# Patient Record
Sex: Female | Born: 1985
Health system: Southern US, Community
[De-identification: ages and names within clinical notes are randomized; demographics above are authoritative.]

## PROBLEM LIST (undated history)

## (undated) DIAGNOSIS — D6859 Other primary thrombophilia: Secondary | ICD-10-CM

## (undated) DIAGNOSIS — D649 Anemia, unspecified: Secondary | ICD-10-CM

## (undated) DIAGNOSIS — J45909 Unspecified asthma, uncomplicated: Secondary | ICD-10-CM

## (undated) DIAGNOSIS — Z8619 Personal history of other infectious and parasitic diseases: Secondary | ICD-10-CM

## (undated) DIAGNOSIS — O365990 Maternal care for other known or suspected poor fetal growth, unspecified trimester, not applicable or unspecified: Secondary | ICD-10-CM

## (undated) HISTORY — DX: Anemia, unspecified: D64.9

## (undated) HISTORY — DX: Unspecified asthma, uncomplicated: J45.909

## (undated) HISTORY — DX: Personal history of other infectious and parasitic diseases: Z86.19

## (undated) HISTORY — PX: NO PAST SURGERIES: SHX2092

## (undated) HISTORY — DX: Maternal care for other known or suspected poor fetal growth, unspecified trimester, not applicable or unspecified: O36.5990

---
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
## 2011-07-27 LAB — HM PAP SMEAR

## 2011-09-02 ENCOUNTER — Ambulatory Visit: Payer: Self-pay | Admitting: Family Medicine

## 2011-09-14 ENCOUNTER — Ambulatory Visit (INDEPENDENT_AMBULATORY_CARE_PROVIDER_SITE_OTHER): Payer: 59 | Admitting: Family Medicine

## 2011-09-14 ENCOUNTER — Encounter: Payer: Self-pay | Admitting: Family Medicine

## 2011-09-14 VITALS — BP 110/60 | HR 80 | Temp 99.2°F | Ht 65.5 in | Wt 113.2 lb

## 2011-09-14 DIAGNOSIS — Z0000 Encounter for general adult medical examination without abnormal findings: Secondary | ICD-10-CM

## 2011-09-14 LAB — HEPATIC FUNCTION PANEL
ALT: 14 U/L (ref 0–35)
AST: 20 U/L (ref 0–37)
Albumin: 4.4 g/dL (ref 3.5–5.2)
Alkaline Phosphatase: 48 U/L (ref 39–117)
Bilirubin, Direct: 0 mg/dL (ref 0.0–0.3)
Total Bilirubin: 0.6 mg/dL (ref 0.3–1.2)
Total Protein: 8 g/dL (ref 6.0–8.3)

## 2011-09-14 LAB — POCT URINALYSIS DIPSTICK
Bilirubin, UA: NEGATIVE
Glucose, UA: NEGATIVE
Ketones, UA: NEGATIVE
Leukocytes, UA: NEGATIVE
Nitrite, UA: NEGATIVE
Protein, UA: NEGATIVE
Spec Grav, UA: 1.02
Urobilinogen, UA: 0.2
pH, UA: 6.5

## 2011-09-14 LAB — CBC WITH DIFFERENTIAL/PLATELET
Basophils Absolute: 0 10*3/uL (ref 0.0–0.1)
Basophils Relative: 0.6 % (ref 0.0–3.0)
Eosinophils Absolute: 0.2 10*3/uL (ref 0.0–0.7)
Eosinophils Relative: 3.2 % (ref 0.0–5.0)
HCT: 41.9 % (ref 36.0–46.0)
Hemoglobin: 13.9 g/dL (ref 12.0–15.0)
Lymphocytes Relative: 42.8 % (ref 12.0–46.0)
Lymphs Abs: 2.3 10*3/uL (ref 0.7–4.0)
MCHC: 33.3 g/dL (ref 30.0–36.0)
MCV: 86 fl (ref 78.0–100.0)
Monocytes Absolute: 0.3 10*3/uL (ref 0.1–1.0)
Monocytes Relative: 5.9 % (ref 3.0–12.0)
Neutro Abs: 2.6 10*3/uL (ref 1.4–7.7)
Neutrophils Relative %: 47.5 % (ref 43.0–77.0)
Platelets: 249 10*3/uL (ref 150.0–400.0)
RBC: 4.87 Mil/uL (ref 3.87–5.11)
RDW: 13.1 % (ref 11.5–14.6)
WBC: 5.4 10*3/uL (ref 4.5–10.5)

## 2011-09-14 LAB — TSH: TSH: 2.48 u[IU]/mL (ref 0.35–5.50)

## 2011-09-14 LAB — BASIC METABOLIC PANEL
BUN: 10 mg/dL (ref 6–23)
CO2: 26 mEq/L (ref 19–32)
Calcium: 9.4 mg/dL (ref 8.4–10.5)
Chloride: 105 mEq/L (ref 96–112)
Creatinine, Ser: 0.6 mg/dL (ref 0.4–1.2)
GFR: 118.56 mL/min (ref >60.00)
Glucose, Bld: 87 mg/dL (ref 70–99)
Potassium: 4 mEq/L (ref 3.5–5.1)
Sodium: 140 mEq/L (ref 135–145)

## 2011-09-14 LAB — LIPID PANEL
Cholesterol: 197 mg/dL (ref 0–200)
HDL: 66.3 mg/dL (ref >39.00)
LDL Cholesterol: 114 mg/dL — ABNORMAL HIGH (ref 0–99)
Total CHOL/HDL Ratio: 3
Triglycerides: 84 mg/dL (ref 0.0–149.0)
VLDL: 16.8 mg/dL (ref 0.0–40.0)

## 2011-09-14 NOTE — Patient Instructions (Addendum)
Preventive Care for Adults, Female A healthy lifestyle and preventive care can promote health and wellness. Preventive health guidelines for women include the following key practices.  A routine yearly physical is a good way to check with your caregiver about your health and preventive screening. It is a chance to share any concerns and updates on your health, and to receive a thorough exam.   Visit your dentist for a routine exam and preventive care every 6 months. Brush your teeth twice a day and floss once a day. Good oral hygiene prevents tooth decay and gum disease.   The frequency of eye exams is based on your age, health, family medical history, use of contact lenses, and other factors. Follow your caregiver's recommendations for frequency of eye exams.   Eat a healthy diet. Foods like vegetables, fruits, whole grains, low-fat dairy products, and lean protein foods contain the nutrients you need without too many calories. Decrease your intake of foods high in solid fats, added sugars, and salt. Eat the right amount of calories for you.Get information about a proper diet from your caregiver, if necessary.   Regular physical exercise is one of the most important things you can do for your health. Most adults should get at least 150 minutes of moderate-intensity exercise (any activity that increases your heart rate and causes you to sweat) each week. In addition, most adults need muscle-strengthening exercises on 2 or more days a week.   Maintain a healthy weight. The body mass index (BMI) is a screening tool to identify possible weight problems. It provides an estimate of body fat based on height and weight. Your caregiver can help determine your BMI, and can help you achieve or maintain a healthy weight.For adults 20 years and older:   A BMI below 18.5 is considered underweight.   A BMI of 18.5 to 24.9 is normal.   A BMI of 25 to 29.9 is considered overweight.   A BMI of 30 and above is  considered obese.   Maintain normal blood lipids and cholesterol levels by exercising and minimizing your intake of saturated fat. Eat a balanced diet with plenty of fruit and vegetables. Blood tests for lipids and cholesterol should begin at age 20 and be repeated every 5 years. If your lipid or cholesterol levels are high, you are over 50, or you are at high risk for heart disease, you may need your cholesterol levels checked more frequently.Ongoing high lipid and cholesterol levels should be treated with medicines if diet and exercise are not effective.   If you smoke, find out from your caregiver how to quit. If you do not use tobacco, do not start.   If you are pregnant, do not drink alcohol. If you are breastfeeding, be very cautious about drinking alcohol. If you are not pregnant and choose to drink alcohol, do not exceed 1 drink per day. One drink is considered to be 12 ounces (355 mL) of beer, 5 ounces (148 mL) of wine, or 1.5 ounces (44 mL) of liquor.   Avoid use of street drugs. Do not share needles with anyone. Ask for help if you need support or instructions about stopping the use of drugs.   High blood pressure causes heart disease and increases the risk of stroke. Your blood pressure should be checked at least every 1 to 2 years. Ongoing high blood pressure should be treated with medicines if weight loss and exercise are not effective.   If you are 55 to 27   years old, ask your caregiver if you should take aspirin to prevent strokes.   Diabetes screening involves taking a blood sample to check your fasting blood sugar level. This should be done once every 3 years, after age 45, if you are within normal weight and without risk factors for diabetes. Testing should be considered at a younger age or be carried out more frequently if you are overweight and have at least 1 risk factor for diabetes.   Breast cancer screening is essential preventive care for women. You should practice "breast  self-awareness." This means understanding the normal appearance and feel of your breasts and may include breast self-examination. Any changes detected, no matter how small, should be reported to a caregiver. Women in their 20s and 30s should have a clinical breast exam (CBE) by a caregiver as part of a regular health exam every 1 to 3 years. After age 40, women should have a CBE every year. Starting at age 40, women should consider having a mammography (breast X-ray test) every year. Women who have a family history of breast cancer should talk to their caregiver about genetic screening. Women at a high risk of breast cancer should talk to their caregivers about having magnetic resonance imaging (MRI) and a mammography every year.   The Pap test is a screening test for cervical cancer. A Pap test can show cell changes on the cervix that might become cervical cancer if left untreated. A Pap test is a procedure in which cells are obtained and examined from the lower end of the uterus (cervix).   Women should have a Pap test starting at age 21.   Between ages 21 and 29, Pap tests should be repeated every 2 years.   Beginning at age 30, you should have a Pap test every 3 years as long as the past 3 Pap tests have been normal.   Some women have medical problems that increase the chance of getting cervical cancer. Talk to your caregiver about these problems. It is especially important to talk to your caregiver if a new problem develops soon after your last Pap test. In these cases, your caregiver may recommend more frequent screening and Pap tests.   The above recommendations are the same for women who have or have not gotten the vaccine for human papillomavirus (HPV).   If you had a hysterectomy for a problem that was not cancer or a condition that could lead to cancer, then you no longer need Pap tests. Even if you no longer need a Pap test, a regular exam is a good idea to make sure no other problems are  starting.   If you are between ages 65 and 70, and you have had normal Pap tests going back 10 years, you no longer need Pap tests. Even if you no longer need a Pap test, a regular exam is a good idea to make sure no other problems are starting.   If you have had past treatment for cervical cancer or a condition that could lead to cancer, you need Pap tests and screening for cancer for at least 20 years after your treatment.   If Pap tests have been discontinued, risk factors (such as a new sexual partner) need to be reassessed to determine if screening should be resumed.   The HPV test is an additional test that may be used for cervical cancer screening. The HPV test looks for the virus that can cause the cell changes on the cervix.   The cells collected during the Pap test can be tested for HPV. The HPV test could be used to screen women aged 30 years and older, and should be used in women of any age who have unclear Pap test results. After the age of 30, women should have HPV testing at the same frequency as a Pap test.   Colorectal cancer can be detected and often prevented. Most routine colorectal cancer screening begins at the age of 50 and continues through age 75. However, your caregiver may recommend screening at an earlier age if you have risk factors for colon cancer. On a yearly basis, your caregiver may provide home test kits to check for hidden blood in the stool. Use of a small camera at the end of a tube, to directly examine the colon (sigmoidoscopy or colonoscopy), can detect the earliest forms of colorectal cancer. Talk to your caregiver about this at age 50, when routine screening begins. Direct examination of the colon should be repeated every 5 to 10 years through age 75, unless early forms of pre-cancerous polyps or small growths are found.   Hepatitis C blood testing is recommended for all people born from 1945 through 1965 and any individual with known risks for hepatitis C.    Practice safe sex. Use condoms and avoid high-risk sexual practices to reduce the spread of sexually transmitted infections (STIs). STIs include gonorrhea, chlamydia, syphilis, trichomonas, herpes, HPV, and human immunodeficiency virus (HIV). Herpes, HIV, and HPV are viral illnesses that have no cure. They can result in disability, cancer, and death. Sexually active women aged 25 and younger should be checked for chlamydia. Older women with new or multiple partners should also be tested for chlamydia. Testing for other STIs is recommended if you are sexually active and at increased risk.   Osteoporosis is a disease in which the bones lose minerals and strength with aging. This can result in serious bone fractures. The risk of osteoporosis can be identified using a bone density scan. Women ages 65 and over and women at risk for fractures or osteoporosis should discuss screening with their caregivers. Ask your caregiver whether you should take a calcium supplement or vitamin D to reduce the rate of osteoporosis.   Menopause can be associated with physical symptoms and risks. Hormone replacement therapy is available to decrease symptoms and risks. You should talk to your caregiver about whether hormone replacement therapy is right for you.   Use sunscreen with sun protection factor (SPF) of 30 or more. Apply sunscreen liberally and repeatedly throughout the day. You should seek shade when your shadow is shorter than you. Protect yourself by wearing long sleeves, pants, a wide-brimmed hat, and sunglasses year round, whenever you are outdoors.   Once a month, do a whole body skin exam, using a mirror to look at the skin on your back. Notify your caregiver of new moles, moles that have irregular borders, moles that are larger than a pencil eraser, or moles that have changed in shape or color.   Stay current with required immunizations.   Influenza. You need a dose every fall (or winter). The composition of  the flu vaccine changes each year, so being vaccinated once is not enough.   Pneumococcal polysaccharide. You need 1 to 2 doses if you smoke cigarettes or if you have certain chronic medical conditions. You need 1 dose at age 65 (or older) if you have never been vaccinated.   Tetanus, diphtheria, pertussis (Tdap, Td). Get 1 dose of   Tdap vaccine if you are younger than age 65, are over 65 and have contact with an infant, are a healthcare worker, are pregnant, or simply want to be protected from whooping cough. After that, you need a Td booster dose every 10 years. Consult your caregiver if you have not had at least 3 tetanus and diphtheria-containing shots sometime in your life or have a deep or dirty wound.   HPV. You need this vaccine if you are a woman age 26 or younger. The vaccine is given in 3 doses over 6 months.   Measles, mumps, rubella (MMR). You need at least 1 dose of MMR if you were born in 1957 or later. You may also need a second dose.   Meningococcal. If you are age 19 to 21 and a first-year college student living in a residence hall, or have one of several medical conditions, you need to get vaccinated against meningococcal disease. You may also need additional booster doses.   Zoster (shingles). If you are age 60 or older, you should get this vaccine.   Varicella (chickenpox). If you have never had chickenpox or you were vaccinated but received only 1 dose, talk to your caregiver to find out if you need this vaccine.   Hepatitis A. You need this vaccine if you have a specific risk factor for hepatitis A virus infection or you simply wish to be protected from this disease. The vaccine is usually given as 2 doses, 6 to 18 months apart.   Hepatitis B. You need this vaccine if you have a specific risk factor for hepatitis B virus infection or you simply wish to be protected from this disease. The vaccine is given in 3 doses, usually over 6 months.  Preventive Services /  Frequency Ages 19 to 39  Blood pressure check.** / Every 1 to 2 years.   Lipid and cholesterol check.** / Every 5 years beginning at age 20.   Clinical breast exam.** / Every 3 years for women in their 20s and 30s.   Pap test.** / Every 2 years from ages 21 through 29. Every 3 years starting at age 30 through age 65 or 70 with a history of 3 consecutive normal Pap tests.   HPV screening.** / Every 3 years from ages 30 through ages 65 to 70 with a history of 3 consecutive normal Pap tests.   Hepatitis C blood test.** / For any individual with known risks for hepatitis C.   Skin self-exam. / Monthly.   Influenza immunization.** / Every year.   Pneumococcal polysaccharide immunization.** / 1 to 2 doses if you smoke cigarettes or if you have certain chronic medical conditions.   Tetanus, diphtheria, pertussis (Tdap, Td) immunization. / A one-time dose of Tdap vaccine. After that, you need a Td booster dose every 10 years.   HPV immunization. / 3 doses over 6 months, if you are 26 and younger.   Measles, mumps, rubella (MMR) immunization. / You need at least 1 dose of MMR if you were born in 1957 or later. You may also need a second dose.   Meningococcal immunization. / 1 dose if you are age 19 to 21 and a first-year college student living in a residence hall, or have one of several medical conditions, you need to get vaccinated against meningococcal disease. You may also need additional booster doses.   Varicella immunization.** / Consult your caregiver.   Hepatitis A immunization.** / Consult your caregiver. 2 doses, 6 to 18 months   apart.   Hepatitis B immunization.** / Consult your caregiver. 3 doses usually over 6 months.  Ages 40 to 64  Blood pressure check.** / Every 1 to 2 years.   Lipid and cholesterol check.** / Every 5 years beginning at age 20.   Clinical breast exam.** / Every year after age 40.   Mammogram.** / Every year beginning at age 40 and continuing for as  long as you are in good health. Consult with your caregiver.   Pap test.** / Every 3 years starting at age 30 through age 65 or 70 with a history of 3 consecutive normal Pap tests.   HPV screening.** / Every 3 years from ages 30 through ages 65 to 70 with a history of 3 consecutive normal Pap tests.   Fecal occult blood test (FOBT) of stool. / Every year beginning at age 50 and continuing until age 75. You may not need to do this test if you get a colonoscopy every 10 years.   Flexible sigmoidoscopy or colonoscopy.** / Every 5 years for a flexible sigmoidoscopy or every 10 years for a colonoscopy beginning at age 50 and continuing until age 75.   Hepatitis C blood test.** / For all people born from 1945 through 1965 and any individual with known risks for hepatitis C.   Skin self-exam. / Monthly.   Influenza immunization.** / Every year.   Pneumococcal polysaccharide immunization.** / 1 to 2 doses if you smoke cigarettes or if you have certain chronic medical conditions.   Tetanus, diphtheria, pertussis (Tdap, Td) immunization.** / A one-time dose of Tdap vaccine. After that, you need a Td booster dose every 10 years.   Measles, mumps, rubella (MMR) immunization. / You need at least 1 dose of MMR if you were born in 1957 or later. You may also need a second dose.   Varicella immunization.** / Consult your caregiver.   Meningococcal immunization.** / Consult your caregiver.   Hepatitis A immunization.** / Consult your caregiver. 2 doses, 6 to 18 months apart.   Hepatitis B immunization.** / Consult your caregiver. 3 doses, usually over 6 months.  Ages 65 and over  Blood pressure check.** / Every 1 to 2 years.   Lipid and cholesterol check.** / Every 5 years beginning at age 20.   Clinical breast exam.** / Every year after age 40.   Mammogram.** / Every year beginning at age 40 and continuing for as long as you are in good health. Consult with your caregiver.   Pap test.** /  Every 3 years starting at age 30 through age 65 or 70 with a 3 consecutive normal Pap tests. Testing can be stopped between 65 and 70 with 3 consecutive normal Pap tests and no abnormal Pap or HPV tests in the past 10 years.   HPV screening.** / Every 3 years from ages 30 through ages 65 or 70 with a history of 3 consecutive normal Pap tests. Testing can be stopped between 65 and 70 with 3 consecutive normal Pap tests and no abnormal Pap or HPV tests in the past 10 years.   Fecal occult blood test (FOBT) of stool. / Every year beginning at age 50 and continuing until age 75. You may not need to do this test if you get a colonoscopy every 10 years.   Flexible sigmoidoscopy or colonoscopy.** / Every 5 years for a flexible sigmoidoscopy or every 10 years for a colonoscopy beginning at age 50 and continuing until age 75.   Hepatitis   C blood test.** / For all people born from 1945 through 1965 and any individual with known risks for hepatitis C.   Osteoporosis screening.** / A one-time screening for women ages 65 and over and women at risk for fractures or osteoporosis.   Skin self-exam. / Monthly.   Influenza immunization.** / Every year.   Pneumococcal polysaccharide immunization.** / 1 dose at age 65 (or older) if you have never been vaccinated.   Tetanus, diphtheria, pertussis (Tdap, Td) immunization. / A one-time dose of Tdap vaccine if you are over 65 and have contact with an infant, are a healthcare worker, or simply want to be protected from whooping cough. After that, you need a Td booster dose every 10 years.   Varicella immunization.** / Consult your caregiver.   Meningococcal immunization.** / Consult your caregiver.   Hepatitis A immunization.** / Consult your caregiver. 2 doses, 6 to 18 months apart.   Hepatitis B immunization.** / Check with your caregiver. 3 doses, usually over 6 months.  ** Family history and personal history of risk and conditions may change your caregiver's  recommendations. Document Released: 04/26/2001 Document Revised: 02/17/2011 Document Reviewed: 07/26/2010 [employer] Patient Information 2012 [employer]. 

## 2011-09-14 NOTE — Progress Notes (Signed)
  Subjective:     Tina Burke is a 26 y.o. female and is here for a comprehensive physical exam. The patient reports no problems.  History   Social History  . Marital Status: Married    Spouse Name: N/A    Number of Children: N/A  . Years of Education: N/A   Occupational History  . health care consultant    Social History Main Topics  . Smoking status: Never Smoker   . Smokeless tobacco: Never Used  . Alcohol Use: Yes     Socially  . Drug Use: No  . Sexually Active: Yes -- Female partner(s)   Other Topics Concern  . Not on file   Social History Narrative   Exercise-- no   No health maintenance topics applied.  The following portions of the patient's history were reviewed and updated as appropriate: allergies, current medications, past family history, past medical history, past social history, past surgical history and problem list.  Review of Systems Review of Systems  Constitutional: Negative for activity change, appetite change and fatigue.  HENT: Negative for hearing loss, congestion, tinnitus and ear discharge.  dentist q22m Eyes: Negative for visual disturbance (see optho q2y -- vision corrected to 20/20 with glasses).  Respiratory: Negative for cough, chest tightness and shortness of breath.   Cardiovascular: Negative for chest pain, palpitations and leg swelling.  Gastrointestinal: Negative for abdominal pain, diarrhea, constipation and abdominal distention.  Genitourinary: Negative for urgency, frequency, decreased urine volume and difficulty urinating.  Musculoskeletal: Negative for back pain, arthralgias and gait problem.  Skin: Negative for color change, pallor and rash.  Neurological: Negative for dizziness, light-headedness, numbness and headaches.  Hematological: Negative for adenopathy. Does not bruise/bleed easily.  Psychiatric/Behavioral: Negative for suicidal ideas, confusion, sleep disturbance, self-injury, dysphoric mood, decreased concentration and  agitation.       Objective:    BP 110/60  Pulse 80  Temp 99.2 F (37.3 C) (Oral)  Ht 5' 5.5" (1.664 m)  Wt 113 lb 3.2 oz (51.347 kg)  BMI 18.55 kg/m2  SpO2 96%  LMP 08/06/2011 General appearance: alert, cooperative, appears stated age and no distress Head: Normocephalic, without obvious abnormality, atraumatic Eyes: conjunctivae/corneas clear. PERRL, EOM's intact. Fundi benign. Ears: normal TM's and external ear canals both ears Nose: Nares normal. Septum midline. Mucosa normal. No drainage or sinus tenderness. Throat: lips, mucosa, and tongue normal; teeth and gums normal Neck: no adenopathy, no carotid bruit, no JVD, supple, symmetrical, trachea midline and thyroid not enlarged, symmetric, no tenderness/mass/nodules Back: symmetric, no curvature. ROM normal. No CVA tenderness. Lungs: clear to auscultation bilaterally Breasts: gyn Heart: regular rate and rhythm, S1, S2 normal, no murmur, click, rub or gallop Abdomen: soft, non-tender; bowel sounds normal; no masses,  no organomegaly Pelvic: deferred-- gyn Extremities: extremities normal, atraumatic, no cyanosis or edema Pulses: 2+ and symmetric Skin: Skin color, texture, turgor normal. No rashes or lesions Lymph nodes: Cervical, supraclavicular, and axillary nodes normal. Neurologic: Alert and oriented X 3, normal strength and tone. Normal symmetric reflexes. Normal coordination and gait psych-- no anxiety, no depression    Assessment:    Healthy female exam.       Plan:    ghm utd Check labs See After Visit Summary for Counseling Recommendations

## 2011-09-17 LAB — VITAMIN D 1,25 DIHYDROXY
Vitamin D 1, 25 (OH)2 Total: 126 pg/mL — ABNORMAL HIGH (ref 18–72)
Vitamin D2 1, 25 (OH)2: <8 pg/mL
Vitamin D3 1, 25 (OH)2: 126 pg/mL

## 2011-09-19 ENCOUNTER — Encounter: Payer: Self-pay | Admitting: Family Medicine

## 2012-03-14 NOTE — L&D Delivery Note (Deleted)
Delivery Note At 2:05 PM a viable and healthy female was delivered via Vaginal, Vacuum (Extractor) (Presentation: ;  ).  APGAR: 9, 9; weight pending.   Placenta status: Intact, Spontaneous.  Cord: 3 vessels with the following complications: None.  Cord pH: na  Anesthesia: Local  Episiotomy: None Lacerations: second Suture Repair: 2.0 vicryl rapide Est. Blood Loss (mL): 200  Mom to postpartum.  Baby to Couplet care / Skin to Skin.  Tina Burke J 02/23/2013, 2:19 PM

## 2012-03-14 NOTE — L&D Delivery Note (Signed)
Operative Delivery Note At 2:05 PM a viable and healthy female was delivered via Vaginal, Vacuum Investment banker, operational).  Presentation: vertex; Position: Right,, Occiput,, Anterior; Station: +4.  Verbal consent: obtained from patient.  Risks and benefits discussed in detail.  Risks include, but are not limited to the risks of anesthesia, bleeding, infection, damage to maternal tissues, fetal cephalhematoma.  There is also the risk of inability to effect vaginal delivery of the head, or shoulder dystocia that cannot be resolved by established maneuvers, leading to the need for emergency cesarean section. Indication: Fetal Bradycardia  APGAR: 9, 9; weight 6 lb 2 oz (2778 g).   Placenta status: Intact, Spontaneous.   Cord: 3 vessels with the following complications: None.  Cord pH: na  Anesthesia: Local  Instruments: Kiwi x 2pulls with one pop off Episiotomy: None Lacerations: second Suture Repair: 2.0 vicryl rapide Est. Blood Loss (mL):   Mom to postpartum.  Baby to Couplet care / Skin to Skin.  Henson Fraticelli J 02/23/2013, 2:21 PM

## 2012-04-28 ENCOUNTER — Other Ambulatory Visit: Payer: Self-pay

## 2012-07-25 LAB — OB RESULTS CONSOLE RUBELLA ANTIBODY, IGM: Rubella: IMMUNE

## 2012-07-25 LAB — OB RESULTS CONSOLE HIV ANTIBODY (ROUTINE TESTING): HIV: NONREACTIVE

## 2012-07-25 LAB — OB RESULTS CONSOLE GC/CHLAMYDIA: Gonorrhea: NEGATIVE

## 2012-07-25 LAB — OB RESULTS CONSOLE ANTIBODY SCREEN: Antibody Screen: NEGATIVE

## 2012-07-25 LAB — OB RESULTS CONSOLE ABO/RH: RH Type: POSITIVE

## 2012-09-20 ENCOUNTER — Encounter (HOSPITAL_COMMUNITY): Payer: Self-pay | Admitting: Obstetrics & Gynecology

## 2012-09-25 ENCOUNTER — Other Ambulatory Visit (HOSPITAL_COMMUNITY): Payer: Self-pay | Admitting: Obstetrics & Gynecology

## 2012-09-25 DIAGNOSIS — O289 Unspecified abnormal findings on antenatal screening of mother: Secondary | ICD-10-CM

## 2012-09-28 ENCOUNTER — Ambulatory Visit (HOSPITAL_COMMUNITY)
Admission: RE | Admit: 2012-09-28 | Discharge: 2012-09-28 | Disposition: A | Discharge status: Home / Self Care | Payer: 59 | Source: Ambulatory Visit | Attending: Obstetrics & Gynecology | Admitting: Obstetrics & Gynecology

## 2012-09-28 ENCOUNTER — Encounter (HOSPITAL_COMMUNITY): Payer: Self-pay

## 2012-09-28 DIAGNOSIS — O289 Unspecified abnormal findings on antenatal screening of mother: Secondary | ICD-10-CM

## 2012-09-28 DIAGNOSIS — Z363 Encounter for antenatal screening for malformations: Secondary | ICD-10-CM | POA: Insufficient documentation

## 2012-09-28 DIAGNOSIS — Z1389 Encounter for screening for other disorder: Secondary | ICD-10-CM | POA: Insufficient documentation

## 2012-09-28 DIAGNOSIS — O358XX0 Maternal care for other (suspected) fetal abnormality and damage, not applicable or unspecified: Secondary | ICD-10-CM | POA: Insufficient documentation

## 2012-09-28 NOTE — Progress Notes (Signed)
Genetic Counseling  High-Risk Gestation Note  Appointment Date:  09/28/2012 Referred By: Robley Fries, MD Date of Birth:  05/09/1985 Partner:  Vivien Rossetti    Pregnancy History: G1P0000 Estimated Date of Delivery: 03/04/13 Estimated Gestational Age: [redacted]w[redacted]d Attending: Particia Nearing, MD   [name] and her husband, Mr. Tina Burke, were seen for genetic counseling because of an increased risk for fetal Down syndrome based on Quad screen performed through [facility].  They were counseled regarding the Quad screen result and the associated 1 in 194 risk for fetal Down syndrome.  We reviewed chromosomes, nondisjunction, and the common features and variable prognosis of Down syndrome.  In addition, we reviewed the screen adjusted reduction in risks for trisomy 18 (1 in 3310) and ONTDs (1 in 10,000).  We also discussed other explanations for a screen positive result including: a gestational dating error, differences in maternal metabolism, and normal variation. They understand that this screening is not diagnostic for Down syndrome but provides a risk assessment.  We reviewed available screening options including noninvasive prenatal screening (NIPS)/cell free fetal DNA (cffDNA) testing and detailed ultrasound.  They were counseled that screening tests are used to modify a patient's a priori risk for aneuploidy, typically based on age. This estimate provides a pregnancy specific risk assessment. We reviewed the benefits and limitations of each option. Specifically, we discussed the conditions for which each test screens, the detection rates, and false positive rates of each. They were also counseled regarding diagnostic testing via amniocentesis. We reviewed the approximate 1 in 300-500 risk for complications for amniocentesis, including spontaneous pregnancy loss. [name] previously elected to proceed with NIPS Cathlean Sauer), facilitated through her OB office. She stated that this was drawn on  09/20/12. Results are currently pending.   The patient also expressed interest in having a detailed ultrasound.  A complete ultrasound was performed today. The ultrasound report will be sent under separate cover. There were no visualized fetal anomalies or markers suggestive of aneuploidy. Diagnostic testing was declined today.  They understand that screening tests cannot rule out all birth defects or genetic syndromes. The patient was advised of this limitation and states she still does not want additional testing at this time.   Both family histories were reviewed and found to be contributory for autism for the patient's female maternal first cousin (her maternal aunt's son). He is currently 47 years old, has absent speech but is able to communicate through other means. An underlying etiology for his autism is not known. We discussed that autism is part of the spectrum of conditions referred to as Autistic spectrum disorders (ASD). We discussed that ASDs are among the most common neurodevelopmental disorders, with approximately 1 in 88 children meeting criteria for ASD. Approximately 80% of individuals diagnosed are female. There is strong evidence that genetic factors play a critical role in development of ASD. There have been recent advances in identifying specific genetic causes of ASD, however, there are still many individuals for whom the etiology of the ASD is not known. Once a family has a child with a diagnosis of ASD, there is a 13.5% chance to have another child with ASD. We discussed that recurrence risk estimate data are limited for extended degree relatives, but given the degree of relation (fourth degree relative) to the current pregnancy, recurrence risk would likely be similar to the general population risk, in the case of multifactorial inheritance for this relative. They understand that at this time there is not genetic testing  available for ASD for most families. Without further information  regarding the provided family history, an accurate genetic risk cannot be calculated. Further genetic counseling is warranted if more information is obtained.  [name] denied exposure to environmental toxins or chemical agents. She denied the use of alcohol, tobacco or street drugs. She denied significant viral illnesses during the course of her pregnancy. Her medical and surgical histories were noncontributory.   I counseled this couple for approximately 35 minutes regarding the above risks and available options.   [name], MS,  Certified Genetic Counselor 09/28/2012

## 2012-10-02 ENCOUNTER — Other Ambulatory Visit: Payer: Self-pay

## 2013-01-17 ENCOUNTER — Other Ambulatory Visit: Payer: Self-pay

## 2013-02-20 ENCOUNTER — Encounter (HOSPITAL_COMMUNITY): Payer: Self-pay | Admitting: *Deleted

## 2013-02-20 ENCOUNTER — Telehealth (HOSPITAL_COMMUNITY): Payer: Self-pay | Admitting: *Deleted

## 2013-02-20 NOTE — Telephone Encounter (Signed)
Preadmission screen  

## 2013-02-23 ENCOUNTER — Encounter (HOSPITAL_COMMUNITY): Payer: Self-pay | Admitting: *Deleted

## 2013-02-23 ENCOUNTER — Inpatient Hospital Stay (HOSPITAL_COMMUNITY)
Admission: AD | Admit: 2013-02-23 | Discharge: 2013-02-25 | DRG: 775 | Disposition: A | Discharge status: Home / Self Care | Payer: 59 | Source: Ambulatory Visit | Attending: Obstetrics and Gynecology | Admitting: Obstetrics and Gynecology

## 2013-02-23 DIAGNOSIS — IMO0001 Reserved for inherently not codable concepts without codable children: Secondary | ICD-10-CM

## 2013-02-23 DIAGNOSIS — O365990 Maternal care for other known or suspected poor fetal growth, unspecified trimester, not applicable or unspecified: Principal | ICD-10-CM | POA: Diagnosis present

## 2013-02-23 LAB — SYPHILIS: RPR W/REFLEX TO RPR TITER AND TREPONEMAL ANTIBODIES, TRADITIONAL SCREENING AND DIAGNOSIS ALGORITHM: RPR Ser Ql: NONREACTIVE

## 2013-02-23 LAB — CBC
HCT: 38.4 % (ref 36.0–46.0)
MCV: 83.3 fL (ref 78.0–100.0)
RDW: 14.6 % (ref 11.5–15.5)
WBC: 14 10*3/uL — ABNORMAL HIGH (ref 4.0–10.5)

## 2013-02-23 MED ORDER — IBUPROFEN 600 MG PO TABS
600.0000 mg | ORAL_TABLET | Freq: Four times a day (QID) | ORAL | Status: DC
  Administered 2013-02-23 – 2013-02-25 (×7): 600 mg via ORAL
  Filled 2013-02-23 (×7): qty 1

## 2013-02-23 MED ORDER — PRENATAL MULTIVITAMIN CH
1.0000 | ORAL_TABLET | Freq: Every day | ORAL | Status: DC
  Administered 2013-02-24: 1 via ORAL
  Filled 2013-02-23: qty 1

## 2013-02-23 MED ORDER — FENTANYL 2.5 MCG/ML BUPIVACAINE 1/10 % EPIDURAL INFUSION (WH - ANES): 14.0000 mL/h | INTRAMUSCULAR | Status: DC | PRN

## 2013-02-23 MED ORDER — ONDANSETRON HCL 4 MG PO TABS: 4.0000 mg | ORAL_TABLET | ORAL | Status: DC | PRN

## 2013-02-23 MED ORDER — LANOLIN HYDROUS EX OINT: TOPICAL_OINTMENT | CUTANEOUS | Status: DC | PRN

## 2013-02-23 MED ORDER — LIDOCAINE HCL (PF) 1 % IJ SOLN
INTRAMUSCULAR | Status: AC
  Filled 2013-02-23: qty 30

## 2013-02-23 MED ORDER — WITCH HAZEL-GLYCERIN EX PADS: 1.0000 "application " | MEDICATED_PAD | CUTANEOUS | Status: DC | PRN

## 2013-02-23 MED ORDER — ONDANSETRON HCL 4 MG/2ML IJ SOLN: 4.0000 mg | INTRAMUSCULAR | Status: DC | PRN

## 2013-02-23 MED ORDER — METHYLERGONOVINE MALEATE 0.2 MG PO TABS: 0.2000 mg | ORAL_TABLET | ORAL | Status: DC | PRN

## 2013-02-23 MED ORDER — TETANUS-DIPHTH-ACELL PERTUSSIS 5-2.5-18.5 LF-MCG/0.5 IM SUSP
0.5000 mL | Freq: Once | INTRAMUSCULAR | Status: DC
  Filled 2013-02-23: qty 0.5

## 2013-02-23 MED ORDER — EPHEDRINE 5 MG/ML INJ: 10.0000 mg | INTRAVENOUS | Status: DC | PRN

## 2013-02-23 MED ORDER — OXYTOCIN 40 UNITS IN LACTATED RINGERS INFUSION - SIMPLE MED
INTRAVENOUS | Status: AC
  Filled 2013-02-23: qty 1000

## 2013-02-23 MED ORDER — OXYCODONE-ACETAMINOPHEN 5-325 MG PO TABS
1.0000 | ORAL_TABLET | ORAL | Status: DC | PRN
Start: 2013-02-23 — End: 2013-02-25
  Administered 2013-02-24 – 2013-02-25 (×3): 1 via ORAL
  Filled 2013-02-23 (×3): qty 1

## 2013-02-23 MED ORDER — DIBUCAINE 1 % RE OINT
1.0000 "application " | TOPICAL_OINTMENT | RECTAL | Status: DC | PRN
  Filled 2013-02-23: qty 28

## 2013-02-23 MED ORDER — ZOLPIDEM TARTRATE 5 MG PO TABS: 5.0000 mg | ORAL_TABLET | Freq: Every evening | ORAL | Status: DC | PRN

## 2013-02-23 MED ORDER — IBUPROFEN 600 MG PO TABS
600.0000 mg | ORAL_TABLET | Freq: Four times a day (QID) | ORAL | Status: DC | PRN
  Administered 2013-02-23: 600 mg via ORAL
  Filled 2013-02-23: qty 1

## 2013-02-23 MED ORDER — DIPHENHYDRAMINE HCL 25 MG PO CAPS: 25.0000 mg | ORAL_CAPSULE | Freq: Four times a day (QID) | ORAL | Status: DC | PRN

## 2013-02-23 MED ORDER — OXYTOCIN 40 UNITS IN LACTATED RINGERS INFUSION - SIMPLE MED
62.5000 mL/h | INTRAVENOUS | Status: DC
  Administered 2013-02-23: 62.5 mL/h via INTRAVENOUS

## 2013-02-23 MED ORDER — LACTATED RINGERS IV SOLN: 500.0000 mL | Freq: Once | INTRAVENOUS | Status: DC

## 2013-02-23 MED ORDER — CITRIC ACID-SODIUM CITRATE 334-500 MG/5ML PO SOLN: 30.0000 mL | ORAL | Status: DC | PRN

## 2013-02-23 MED ORDER — PHENYLEPHRINE 40 MCG/ML (10ML) SYRINGE FOR IV PUSH (FOR BLOOD PRESSURE SUPPORT): 80.0000 ug | PREFILLED_SYRINGE | INTRAVENOUS | Status: DC | PRN

## 2013-02-23 MED ORDER — LIDOCAINE HCL (PF) 1 % IJ SOLN
30.0000 mL | INTRAMUSCULAR | Status: AC | PRN
  Administered 2013-02-23: 30 mL via SUBCUTANEOUS
  Filled 2013-02-23: qty 30

## 2013-02-23 MED ORDER — FLEET ENEMA 7-19 GM/118ML RE ENEM: 1.0000 | ENEMA | Freq: Once | RECTAL | Status: DC

## 2013-02-23 MED ORDER — LACTATED RINGERS IV SOLN: 500.0000 mL | INTRAVENOUS | Status: DC | PRN

## 2013-02-23 MED ORDER — ACETAMINOPHEN 325 MG PO TABS: 650.0000 mg | ORAL_TABLET | ORAL | Status: DC | PRN

## 2013-02-23 MED ORDER — OXYTOCIN BOLUS FROM INFUSION: 500.0000 mL | INTRAVENOUS | Status: DC

## 2013-02-23 MED ORDER — METHYLERGONOVINE MALEATE 0.2 MG/ML IJ SOLN: 0.2000 mg | INTRAMUSCULAR | Status: DC | PRN

## 2013-02-23 MED ORDER — SIMETHICONE 80 MG PO CHEW: 80.0000 mg | CHEWABLE_TABLET | ORAL | Status: DC | PRN

## 2013-02-23 MED ORDER — ONDANSETRON HCL 4 MG/2ML IJ SOLN: 4.0000 mg | Freq: Four times a day (QID) | INTRAMUSCULAR | Status: DC | PRN

## 2013-02-23 MED ORDER — DIPHENHYDRAMINE HCL 50 MG/ML IJ SOLN: 12.5000 mg | INTRAMUSCULAR | Status: DC | PRN

## 2013-02-23 MED ORDER — SENNOSIDES-DOCUSATE SODIUM 8.6-50 MG PO TABS
2.0000 | ORAL_TABLET | ORAL | Status: DC
  Administered 2013-02-24 (×2): 2 via ORAL
  Filled 2013-02-23 (×2): qty 2

## 2013-02-23 MED ORDER — BENZOCAINE-MENTHOL 20-0.5 % EX AERO
1.0000 "application " | INHALATION_SPRAY | CUTANEOUS | Status: DC | PRN
  Administered 2013-02-24: 1 via TOPICAL
  Filled 2013-02-23 (×2): qty 56

## 2013-02-23 MED ORDER — LACTATED RINGERS IV SOLN: INTRAVENOUS | Status: DC

## 2013-02-23 MED ORDER — OXYCODONE-ACETAMINOPHEN 5-325 MG PO TABS: 1.0000 | ORAL_TABLET | ORAL | Status: DC | PRN

## 2013-02-23 NOTE — H&P (Signed)
Tina Burke is a 27 y.o. female presenting for labor. Maternal Medical History:  Reason for admission: Contractions.   Contractions: Onset was less than 1 hour ago.   Frequency: regular.   Perceived severity is moderate.    Fetal activity: Perceived fetal activity is normal.      OB History   Grav Para Term Preterm Abortions TAB SAB Ect Mult Living   1 0 0 0 0 0 0 0 0 0      Past Medical History  Diagnosis Date  . Asthma   . Anemia   . Hx of varicella   . IUGR (intrauterine growth restriction) affecting care of mother    Past Surgical History  Procedure Laterality Date  . No past surgeries     Family History: family history includes Arthritis in her mother; Hyperlipidemia in her father and mother; Hypertension in her father and mother. Social History:  reports that she has never smoked. She has never used smokeless tobacco. She reports that she drinks alcohol. She reports that she does not use illicit drugs.   Prenatal Transfer Tool  Maternal Diabetes: No Genetic Screening: Abnormal:  Results: Other: Maternal Ultrasounds/Referrals: Normal Fetal Ultrasounds or other Referrals:  None Maternal Substance Abuse:  No Significant Maternal Medications:  None Significant Maternal Lab Results:  None Other Comments:  None  Review of Systems  Unable to perform ROS Constitutional: Negative.     Dilation: 10 Effacement (%): 100 Station: +1 Exam by:: Dellie Burns, RN BSN Blood pressure 140/88, pulse 102, temperature 98 F (36.7 C), temperature source Oral, resp. rate 22, height 5\' 6"  (1.676 m), weight 63.957 kg (141 lb), last menstrual period 05/28/2012. Maternal Exam:  Uterine Assessment: Contraction strength is moderate.  Contraction frequency is regular.   Abdomen: Patient reports no abdominal tenderness. Fetal presentation: vertex     Physical Exam  Vitals reviewed. Constitutional: She is oriented to person, place, and time. She appears well-developed.   HENT:  Head: Normocephalic.  Eyes: Pupils are equal, round, and reactive to light.  Neck: Normal range of motion.  Cardiovascular: Normal rate.   Respiratory: Effort normal.  GI: Soft.  Genitourinary: Vagina normal and uterus normal.  Musculoskeletal: Normal range of motion.  Neurological: She is oriented to person, place, and time.    Prenatal labs: ABO, Rh: A/Positive/-- (05/14 0000) Antibody: Negative (05/14 0000) Rubella: Immune (05/14 0000) RPR: Nonreactive (05/14 0000)  HBsAg: Negative (05/14 0000)  HIV: Non-reactive (05/14 0000)  GBS: Negative (11/18 0000)   Assessment/Plan: Active labor at term SVD   Tina Burke J 02/23/2013, 1:54 PM

## 2013-02-23 NOTE — MAU Note (Signed)
Patient states she is having contractions every 3-5 minutes with bloody show. Denies leaking and reports good fetal movement. States she was 3.5 cm last Tuesday.

## 2013-02-23 NOTE — MAU Note (Signed)
HMitchell, RN charge notified of pt's cervical exam, 8cm. Will go to room 165.

## 2013-02-23 NOTE — Progress Notes (Signed)
Dr Billy Coast notified of pts complaints, VE 8cm, orders received to admit pt.

## 2013-02-24 LAB — CBC
HCT: 33.7 % — ABNORMAL LOW (ref 36.0–46.0)
Hemoglobin: 11.1 g/dL — ABNORMAL LOW (ref 12.0–15.0)
MCHC: 32.9 g/dL (ref 30.0–36.0)
RBC: 4.03 MIL/uL (ref 3.87–5.11)
WBC: 16.1 10*3/uL — ABNORMAL HIGH (ref 4.0–10.5)

## 2013-02-24 MED ORDER — SUCROSE 24% NICU/PEDS ORAL SOLUTION
0.5000 mL | OROMUCOSAL | Status: DC | PRN
  Filled 2013-02-24: qty 0.5

## 2013-02-24 MED ORDER — ACETAMINOPHEN FOR CIRCUMCISION 160 MG/5 ML
40.0000 mg | Freq: Once | ORAL | Status: DC
  Filled 2013-02-24: qty 2.5

## 2013-02-24 MED ORDER — LIDOCAINE 1%/NA BICARB 0.1 MEQ INJECTION
0.8000 mL | INJECTION | Freq: Once | INTRAVENOUS | Status: DC
  Filled 2013-02-24: qty 1

## 2013-02-24 MED ORDER — EPINEPHRINE TOPICAL FOR CIRCUMCISION 0.1 MG/ML: 1.0000 [drp] | TOPICAL | Status: DC | PRN

## 2013-02-24 MED ORDER — ACETAMINOPHEN FOR CIRCUMCISION 160 MG/5 ML
40.0000 mg | ORAL | Status: DC | PRN
  Filled 2013-02-24: qty 2.5

## 2013-02-24 NOTE — Progress Notes (Signed)
Patient ID: Tina Burke, female   DOB: 01/15/1986, 27 y.o.   MRN: [id number] PPD # 1 SVD  S:  Reports feeling well             Tolerating po/ No nausea or vomiting             Bleeding is light             Pain controlled with ibuprofen (OTC)             Up ad lib / ambulatory / voiding without difficulties    Newborn  Information for the patient's newborn:  Tina Burke, Tina Burke [[id number]]  female  breast feeding  / Circumcision planning   O:  A & O x 3, in no apparent distress              VS:  Filed Vitals:   02/23/13 1551 02/23/13 1649 02/23/13 2040 02/24/13 0500  BP: 112/75 117/76 117/70 107/72  Pulse: 91 94 89 84  Temp: 99 F (37.2 C) 99 F (37.2 C) 98.6 F (37 C) 98.5 F (36.9 C)  TempSrc: Oral Oral Oral Oral  Resp: 18 18 18 14   Height:      Weight:      SpO2: 100%       LABS:  Recent Labs  02/23/13 1325 02/24/13 0623  WBC 14.0* 16.1*  HGB 13.2 11.1*  HCT 38.4 33.7*  PLT 189 164    Blood type: A/Positive/-- (05/14 0000)  Rubella: Immune (05/14 0000)   I&O: I/O last 3 completed shifts: In: -  Out: 200 [Blood:200]             Lungs: Clear and unlabored  Heart: regular rate and rhythm / no murmurs  Abdomen: soft, non-tender, non-distended             Fundus: firm, non-tender, U-2  Perineum: 2nd degree repair healing well  Lochia: small  Extremities: no edema, no calf pain or tenderness, no Homans    A/P: PPD # 1  27 y.o., G1P0000   Principal Problem:   Vacuum extractor delivery, delivered (12/13) Active Problems:   Active labor   Postpartum care following vaginal delivery (12/13)   Doing well - stable status  Routine post partum orders  Anticipate discharge tomorrow    Tina Burke, M, MSN, CNM 02/24/2013, 10:39 AM

## 2013-02-25 ENCOUNTER — Encounter (HOSPITAL_COMMUNITY): Payer: Self-pay | Admitting: *Deleted

## 2013-02-25 MED ORDER — IBUPROFEN 600 MG PO TABS: 600.0000 mg | ORAL_TABLET | Freq: Four times a day (QID) | ORAL | Status: DC

## 2013-02-25 MED ORDER — OXYCODONE-ACETAMINOPHEN 5-325 MG PO TABS: 1.0000 | ORAL_TABLET | ORAL | Status: DC | PRN

## 2013-02-25 NOTE — Discharge Summary (Signed)
Obstetric Discharge Summary  Reason for Admission: onset of labor Prenatal Procedures: NST and ultrasound - IUGR Intrapartum Procedures: spontaneous vaginal delivery and vacuum Postpartum Procedures: none Complications-Operative and Postpartum: 2nd degree perineal laceration Hemoglobin  Date Value Range Status  02/24/2013 11.1* 12.0 - 15.0 g/dL Final     HCT  Date Value Range Status  02/24/2013 33.7* 36.0 - 46.0 % Final    Physical Exam:  General: alert, cooperative and no distress Lochia: appropriate Uterine Fundus: firm Incision: healing well DVT Evaluation: No evidence of DVT seen on physical exam.  Discharge Diagnoses: Term Pregnancy-delivered  Discharge Information: Date: 02/25/2013 Activity: pelvic rest Diet: routine Medications: PNV, Ibuprofen and Iron Condition: stable Instructions: refer to practice specific booklet Discharge to: home Follow-up Information   Follow up with Lenoard Aden, MD. Schedule an appointment as soon as possible for a visit in 6 weeks.   Specialty:  Obstetrics and Gynecology   Contact information:   [address] [place] [address] [phone]       Newborn Data: Live born female  Birth Weight: 6 lb 2 oz (2778 g) APGAR: 9, 9  Home with mother.  Tina Burke 02/25/2013, 10:18 AM

## 2013-02-25 NOTE — Lactation Note (Signed)
This note was copied from the chart of Tina Galilee Pierron. Lactation Consultation Note: Follow up visit with mom before DC. Mom reports that baby has been nursing well. Reports pain the first few minutes but then eases off. Reviewed wide open mouth and keeping the baby close to the breast throughout the feeding. Baby asleep in grandfather's arms. Reports that he just fed.  Reviewed BFSG and OP appointments as resources for support after DC.No questions at present. To call prn  Patient Name: Tina Burke [id number] Date: 02/25/2013 Reason for consult: Follow-up assessment   Maternal Data    Feeding Feeding Type: Breast Fed Length of feed: 15 min  LATCH Score/Interventions Latch: Grasps breast easily, tongue down, lips flanged, rhythmical sucking.  Audible Swallowing: Spontaneous and intermittent  Type of Nipple: Everted at rest and after stimulation  Comfort (Breast/Nipple): Soft / non-tender     Hold (Positioning): Assistance needed to correctly position infant at breast and maintain latch.  LATCH Score: 9  Lactation Tools Discussed/Used     Consult Status Consult Status: Complete    Tina Burke 02/25/2013, 10:08 AM

## 2013-02-25 NOTE — Progress Notes (Signed)
PPD 2 SVD  S:  Reports feeling tired and frustrated trying to get baby to breastfeed             Tolerating po/ No nausea or vomiting             Bleeding is light             Pain controlled with motrin and percocet             Up ad lib / ambulatory / voiding QS  Newborn breast feeding   O:               VS: BP 108/71  Pulse 88  Temp(Src) 98.1 F (36.7 C) (Oral)  Resp 18  Ht 5\' 6"  (1.676 m)  Wt 63.957 kg (141 lb)  BMI 22.77 kg/m2  SpO2 100%  LMP 05/28/2012   LABS:              Recent Labs  02/23/13 1325 02/24/13 0623  WBC 14.0* 16.1*  HGB 13.2 11.1*  PLT 189 164               Blood type: A/Positive/-- (05/14 0000)  Rubella: Immune (05/14 0000)                             Physical Exam:             Alert and oriented X3  Abdomen: soft, non-tender, non-distended              Fundus: firm, non-tender, U-2  Perineum: mild edema  Lochia: light  Extremities: no edema, no calf pain or tenderness    A: PPD # 2   Doing well - stable status  P: Routine post partum orders  DC home  Marlinda Mike CNM, MSN, [facility] 02/25/2013, 10:16 AM

## 2013-03-04 ENCOUNTER — Inpatient Hospital Stay (HOSPITAL_COMMUNITY): Admission: RE | Admit: 2013-03-04 | Payer: 59 | Source: Ambulatory Visit

## 2014-01-13 ENCOUNTER — Encounter (HOSPITAL_COMMUNITY): Payer: Self-pay | Admitting: *Deleted

## 2014-03-14 NOTE — L&D Delivery Note (Addendum)
Delivery Note Rapid progress of labor. Pudendal block given with 20 cc 1% plain lidocaine.  At 4:55 PM a viable and healthy female was delivered via Vaginal, Spontaneous Delivery (Presentation: Left Occiput Anterior).  APGAR: 9, 9; weight -pending.   Placenta status: Intact, Spontaneous.  Cord: 3 vessels with the following complications: None.  Cord pH: N/A  Anesthesia: Pudendal  Episiotomy: None Lacerations: 2nd degree Suture Repair: 3.0 vicryl rapide Est. Blood Loss (mL):  50 ml  Mom to postpartum.  Baby to Couplet care / Skin to Skin.  Kymia Simi R 12/15/2014, 5:23 PM

## 2014-12-15 ENCOUNTER — Encounter (HOSPITAL_COMMUNITY): Payer: Self-pay | Admitting: *Deleted

## 2014-12-15 ENCOUNTER — Inpatient Hospital Stay (HOSPITAL_COMMUNITY)
Admission: AD | Admit: 2014-12-15 | Discharge: 2014-12-16 | DRG: 775 | Disposition: A | Discharge status: Home / Self Care | Payer: Commercial Managed Care - HMO | Source: Ambulatory Visit | Attending: Obstetrics & Gynecology | Admitting: Obstetrics & Gynecology

## 2014-12-15 DIAGNOSIS — D649 Anemia, unspecified: Secondary | ICD-10-CM | POA: Diagnosis present

## 2014-12-15 DIAGNOSIS — Z8349 Family history of other endocrine, nutritional and metabolic diseases: Secondary | ICD-10-CM | POA: Diagnosis not present

## 2014-12-15 DIAGNOSIS — Z8261 Family history of arthritis: Secondary | ICD-10-CM | POA: Diagnosis not present

## 2014-12-15 DIAGNOSIS — Z3A38 38 weeks gestation of pregnancy: Secondary | ICD-10-CM | POA: Diagnosis not present

## 2014-12-15 DIAGNOSIS — Z8249 Family history of ischemic heart disease and other diseases of the circulatory system: Secondary | ICD-10-CM | POA: Diagnosis not present

## 2014-12-15 DIAGNOSIS — O9902 Anemia complicating childbirth: Secondary | ICD-10-CM | POA: Diagnosis present

## 2014-12-15 DIAGNOSIS — IMO0001 Reserved for inherently not codable concepts without codable children: Secondary | ICD-10-CM

## 2014-12-15 DIAGNOSIS — Z3490 Encounter for supervision of normal pregnancy, unspecified, unspecified trimester: Secondary | ICD-10-CM

## 2014-12-15 LAB — CBC
HCT: 39 % (ref 36.0–46.0)
Hemoglobin: 13 g/dL (ref 12.0–15.0)
MCH: 28.8 pg (ref 26.0–34.0)
MCHC: 33.3 g/dL (ref 30.0–36.0)
MCV: 86.5 fL (ref 78.0–100.0)
PLATELETS: 207 10*3/uL (ref 150–400)
RBC: 4.51 MIL/uL (ref 3.87–5.11)
RDW: 14.3 % (ref 11.5–15.5)
WBC: 16.4 10*3/uL — ABNORMAL HIGH (ref 4.0–10.5)

## 2014-12-15 LAB — OB RESULTS CONSOLE GC/CHLAMYDIA
Chlamydia: NEGATIVE
GC PROBE AMP, GENITAL: NEGATIVE

## 2014-12-15 LAB — OB RESULTS CONSOLE HIV ANTIBODY (ROUTINE TESTING): HIV: NONREACTIVE

## 2014-12-15 MED ORDER — OXYTOCIN BOLUS FROM INFUSION: 500.0000 mL | INTRAVENOUS | Status: DC

## 2014-12-15 MED ORDER — LACTATED RINGERS IV SOLN: 500.0000 mL | INTRAVENOUS | Status: DC | PRN

## 2014-12-15 MED ORDER — FLEET ENEMA 7-19 GM/118ML RE ENEM: 1.0000 | ENEMA | RECTAL | Status: DC | PRN

## 2014-12-15 MED ORDER — SIMETHICONE 80 MG PO CHEW: 80.0000 mg | CHEWABLE_TABLET | ORAL | Status: DC | PRN

## 2014-12-15 MED ORDER — OXYTOCIN 40 UNITS IN LACTATED RINGERS INFUSION - SIMPLE MED: 62.5000 mL/h | INTRAVENOUS | Status: DC

## 2014-12-15 MED ORDER — LACTATED RINGERS IV SOLN: INTRAVENOUS | Status: DC

## 2014-12-15 MED ORDER — LIDOCAINE HCL (PF) 1 % IJ SOLN
30.0000 mL | INTRAMUSCULAR | Status: DC | PRN
  Administered 2014-12-15: 30 mL via SUBCUTANEOUS
  Filled 2014-12-15: qty 30

## 2014-12-15 MED ORDER — BENZOCAINE-MENTHOL 20-0.5 % EX AERO
1.0000 "application " | INHALATION_SPRAY | CUTANEOUS | Status: DC | PRN
  Administered 2014-12-15: 1 via TOPICAL
  Filled 2014-12-15: qty 56

## 2014-12-15 MED ORDER — ONDANSETRON HCL 4 MG/2ML IJ SOLN: 4.0000 mg | Freq: Four times a day (QID) | INTRAMUSCULAR | Status: DC | PRN

## 2014-12-15 MED ORDER — ONDANSETRON HCL 4 MG/2ML IJ SOLN: 4.0000 mg | INTRAMUSCULAR | Status: DC | PRN

## 2014-12-15 MED ORDER — DIBUCAINE 1 % RE OINT: 1.0000 "application " | TOPICAL_OINTMENT | RECTAL | Status: DC | PRN

## 2014-12-15 MED ORDER — OXYCODONE-ACETAMINOPHEN 5-325 MG PO TABS: 2.0000 | ORAL_TABLET | ORAL | Status: DC | PRN

## 2014-12-15 MED ORDER — WITCH HAZEL-GLYCERIN EX PADS: 1.0000 "application " | MEDICATED_PAD | CUTANEOUS | Status: DC | PRN

## 2014-12-15 MED ORDER — ONDANSETRON HCL 4 MG PO TABS: 4.0000 mg | ORAL_TABLET | ORAL | Status: DC | PRN

## 2014-12-15 MED ORDER — CITRIC ACID-SODIUM CITRATE 334-500 MG/5ML PO SOLN: 30.0000 mL | ORAL | Status: DC | PRN

## 2014-12-15 MED ORDER — LIDOCAINE HCL (PF) 1 % IJ SOLN: 30.0000 mL | INTRAMUSCULAR | Status: DC | PRN

## 2014-12-15 MED ORDER — OXYTOCIN 10 UNIT/ML IJ SOLN
INTRAMUSCULAR | Status: AC
  Administered 2014-12-15: 10 [IU] via INTRAMUSCULAR
  Filled 2014-12-15: qty 1

## 2014-12-15 MED ORDER — ZOLPIDEM TARTRATE 5 MG PO TABS: 5.0000 mg | ORAL_TABLET | Freq: Every evening | ORAL | Status: DC | PRN

## 2014-12-15 MED ORDER — OXYCODONE-ACETAMINOPHEN 5-325 MG PO TABS: 1.0000 | ORAL_TABLET | ORAL | Status: DC | PRN

## 2014-12-15 MED ORDER — KETOROLAC TROMETHAMINE 30 MG/ML IJ SOLN
30.0000 mg | Freq: Once | INTRAMUSCULAR | Status: AC
  Administered 2014-12-15: 30 mg via INTRAMUSCULAR
  Filled 2014-12-15: qty 1

## 2014-12-15 MED ORDER — OXYTOCIN BOLUS FROM INFUSION
500.0000 mL | INTRAVENOUS | Status: DC
Start: 2014-12-15 — End: 2014-12-15

## 2014-12-15 MED ORDER — LANOLIN HYDROUS EX OINT: TOPICAL_OINTMENT | CUTANEOUS | Status: DC | PRN

## 2014-12-15 MED ORDER — SENNOSIDES-DOCUSATE SODIUM 8.6-50 MG PO TABS
2.0000 | ORAL_TABLET | ORAL | Status: DC
  Administered 2014-12-15: 2 via ORAL
  Filled 2014-12-15: qty 2

## 2014-12-15 MED ORDER — ACETAMINOPHEN 325 MG PO TABS: 650.0000 mg | ORAL_TABLET | ORAL | Status: DC | PRN

## 2014-12-15 MED ORDER — CITRIC ACID-SODIUM CITRATE 334-500 MG/5ML PO SOLN
30.0000 mL | ORAL | Status: DC | PRN
Start: 2014-12-15 — End: 2014-12-15

## 2014-12-15 MED ORDER — DIPHENHYDRAMINE HCL 25 MG PO CAPS: 25.0000 mg | ORAL_CAPSULE | Freq: Four times a day (QID) | ORAL | Status: DC | PRN

## 2014-12-15 MED ORDER — PRENATAL MULTIVITAMIN CH
1.0000 | ORAL_TABLET | Freq: Every day | ORAL | Status: DC
  Administered 2014-12-16: 1 via ORAL
  Filled 2014-12-15: qty 1

## 2014-12-15 MED ORDER — IBUPROFEN 600 MG PO TABS
600.0000 mg | ORAL_TABLET | Freq: Four times a day (QID) | ORAL | Status: DC
  Administered 2014-12-15 – 2014-12-16 (×3): 600 mg via ORAL
  Filled 2014-12-15 (×3): qty 1

## 2014-12-15 MED ORDER — OXYCODONE-ACETAMINOPHEN 5-325 MG PO TABS
1.0000 | ORAL_TABLET | ORAL | Status: DC | PRN
  Administered 2014-12-16: 1 via ORAL
  Filled 2014-12-15: qty 1

## 2014-12-15 MED ORDER — OXYTOCIN 40 UNITS IN LACTATED RINGERS INFUSION - SIMPLE MED
62.5000 mL/h | INTRAVENOUS | Status: DC
  Filled 2014-12-15: qty 1000

## 2014-12-15 NOTE — Lactation Note (Signed)
This note was copied from the chart of Tina Cheris Tweten. Lactation Consultation Note  Patient Name: Tina Burke [id number] Date: 12/15/2014 Reason for consult: Initial assessment Experienced bf mom. She stated baby latched well after birth but has been too sleepy to feed again. Mom does know how to manually express, colostrum noted bilaterally. No questions or concerns at this time. Aware of lactation services and support group. Will call as needed for bf support.   Maternal Data Formula Feeding for Exclusion: No Has patient been taught Hand Expression?: Yes Does the patient have breastfeeding experience prior to this delivery?: Yes  Feeding Feeding Type: Breast Fed Length of feed: 0 min (baby sleeping. placed s2s 35+ minutes)  LATCH Score/Interventions Latch: Grasps breast easily, tongue down, lips flanged, rhythmical sucking.  Audible Swallowing: Spontaneous and intermittent  Type of Nipple: Everted at rest and after stimulation  Comfort (Breast/Nipple): Soft / non-tender     Hold (Positioning): Assistance needed to correctly position infant at breast and maintain latch. Intervention(s): Breastfeeding basics reviewed;Support Pillows;Position options;Skin to skin  LATCH Score: 9  Lactation Tools Discussed/Used     Consult Status Consult Status: Follow-up Date: 12/16/14 Follow-up type: In-patient    Rulon Eisenmenger 12/15/2014, 8:46 PM

## 2014-12-15 NOTE — H&P (Addendum)
Tina Burke is a 29 y.o. female G2P1001, 14. 5wks, presenting in active labor, 7 cm per MAU RN and she was 3/100% in office today.  GBS(-).  Uncomplicated pregnancy. 1st baby was SGA. This baby with AGA growth in 3rd trim. Healthy mother, mils anemia, on Iron.    History OB History    Gravida Para Term Preterm AB TAB SAB Ectopic Multiple Living   0 0 0 0 0 0 1     Past Medical History  Diagnosis Date  . Asthma   . Anemia   . Hx of varicella   . IUGR (intrauterine growth restriction) affecting care of mother    Past Surgical History  Procedure Laterality Date  . No past surgeries     Family History: family history includes Arthritis in her mother; Hyperlipidemia in her father and mother; Hypertension in her father and mother. Social History:  reports that she has never smoked. She has never used smokeless tobacco. She reports that she drinks alcohol. She reports that she does not use illicit drugs.   Prenatal Transfer Tool  Maternal Diabetes: No Genetic Screening: Normal Maternal Ultrasounds/Referrals: Normal Fetal Ultrasounds or other Referrals:  None Maternal Substance Abuse:  No Significant Maternal Medications:  None Significant Maternal Lab Results:  Lab values include: Group B Strep negative Other Comments:  None  ROS neg   Exam Physical Exam  BP 147/93 mmHg  Pulse 105  Resp 18  A&O x 3, no acute distress. Pleasant HEENT neg, no thyromegaly Lungs CTA bilat CV RRR, S1S2 normal Abdo soft, non tender, non acute Extr no edema/ tenderness Pelvic 7/100%/-1, intact per MAU RN. Tina Burke, RNC FHT 130s- cat I Toco regular q 4-5 min  Prenatal labs: ABO, Rh:  A(+) Antibody:  neg Rubella:  Imm RPR:   neg HBsAg:   neg HIV:   neg GBS:   neg   Assessment/Plan: G2P1, active labor at 38.5 wks, anticipate SVD. FHT I   Tina Burke R 12/15/2014, 3:38 PM

## 2014-12-15 NOTE — Lactation Note (Signed)
This note was copied from the chart of Tina Burke. Lactation Consultation Note  Patient Name: Tina Burke Date: 12/15/2014 Reason for consult: Follow-up assessment;Difficult latch Mom requested lactation because baby was unable to latch to the R breast. Baby will go easily to the L and mom stated it was the same with her older child. The L nipple does appear to be a little larger than the R but the baby seemed to be uncomfortable. After adjusting baby's L arm she latched well and seemed at ease. Mom will alternate breast at feedings and seek help as needed.   Maternal Data Has patient been taught Hand Expression?: Yes Does the patient have breastfeeding experience prior to this delivery?: Yes  Feeding Feeding Type: Breast Fed Length of feed: 10 min  LATCH Score/Interventions Latch: Repeated attempts needed to sustain latch, nipple held in mouth throughout feeding, stimulation needed to elicit sucking reflex. Intervention(s): Adjust position  Audible Swallowing: A few with stimulation Intervention(s): Alternate breast massage  Type of Nipple: Everted at rest and after stimulation  Comfort (Breast/Nipple): Soft / non-tender     Hold (Positioning): Assistance needed to correctly position infant at breast and maintain latch. Intervention(s): Support Pillows  LATCH Score: 7  Lactation Tools Discussed/Used WIC Program: No   Consult Status Consult Status: Follow-up Date: 12/16/14 Follow-up type: In-patient    Rulon Eisenmenger 12/15/2014, 10:21 PM

## 2014-12-16 LAB — CBC
HEMATOCRIT: 37.4 % (ref 36.0–46.0)
HEMOGLOBIN: 12.4 g/dL (ref 12.0–15.0)
MCH: 28.8 pg (ref 26.0–34.0)
MCHC: 33.2 g/dL (ref 30.0–36.0)
MCV: 86.8 fL (ref 78.0–100.0)
PLATELETS: 203 10*3/uL (ref 150–400)
RBC: 4.31 MIL/uL (ref 3.87–5.11)
RDW: 14.3 % (ref 11.5–15.5)
WBC: 17.9 10*3/uL — AB (ref 4.0–10.5)

## 2014-12-16 LAB — RPR: RPR Ser Ql: NONREACTIVE

## 2014-12-16 MED ORDER — OXYCODONE-ACETAMINOPHEN 5-325 MG PO TABS: 1.0000 | ORAL_TABLET | ORAL | Status: DC | PRN

## 2014-12-16 MED ORDER — IBUPROFEN 800 MG PO TABS: 800.0000 mg | ORAL_TABLET | Freq: Four times a day (QID) | ORAL | Status: DC

## 2014-12-16 NOTE — Lactation Note (Signed)
This note was copied from the chart of Tina Amela Handley. Lactation Consultation Note  Patient Name: Tina Burke [id number] Date: 12/16/2014 Reason for consult: Follow-up assessment  With this experienced breastfeeding mom, now 20 hours post partum, and going home early at 5 pm. Mom denies any questions/cocnerns, and said that the baby is  breastfeeding well. Breast care briefly reviewed. Mom knows to call PRN.   Maternal Data    Feeding    LATCH Score/Interventions                      Lactation Tools Discussed/Used     Consult Status Consult Status: Complete Follow-up type: Call as needed    Alfred Levins 12/16/2014, 1:38 PM

## 2014-12-16 NOTE — Discharge Summary (Signed)
Obstetric Discharge Summary  Reason for Admission: onset of labor Prenatal Procedures: none Intrapartum Procedures: spontaneous vaginal delivery Postpartum Procedures: none Complications-Operative and Postpartum: 2nd degree perineal laceration HEMOGLOBIN  Date Value Ref Range Status  12/16/2014 12.4 12.0 - 15.0 g/dL Final   HCT  Date Value Ref Range Status  12/16/2014 37.4 36.0 - 46.0 % Final    Physical Exam:  General: alert, cooperative and no distress Lochia: appropriate Uterine Fundus: firm perineum: healing well DVT Evaluation: No evidence of DVT seen on physical exam.  Discharge Diagnoses: Term Pregnancy-delivered  Discharge Information: Date: 12/16/2014 Activity: pelvic rest Diet: routine Medications: PNV, Ibuprofen and Percocet Condition: stable Instructions: refer to practice specific booklet Discharge to: home Follow-up Information    Follow up with MODY,VAISHALI R, MD In 6 weeks.   Specialty:  Obstetrics and Gynecology   Contact information:   [address] [place] [address] [phone]       Newborn Data: Live born female  Birth Weight: 6 lb 4.5 oz (2850 g) APGAR: 9, 9  Home with mother.  Marlinda Mike 12/16/2014, 8:59 AM

## 2014-12-16 NOTE — Progress Notes (Signed)
PPD 1 SVD w/ 2nd degree repair  S:  Reports feeling well - desires Dc today             Tolerating po/ No nausea or vomiting             Bleeding is light             Pain controlled with motrin and percocet but still intense cramps             Up ad lib / ambulatory / voiding QS  Newborn breast feeding   O:               VS: BP 111/70 mmHg  Pulse 82  Temp(Src) 98 F (36.7 C) (Oral)  Resp 20  Ht  (1.676 m)  Wt 64.864 kg (143 lb)  BMI 23.09 kg/m2  Breastfeeding? Unknown   LABS:              Recent Labs  12/15/14 1548 12/16/14 0545  WBC 16.4* 17.9*  HGB 13.0 12.4  PLT 207 203               Blood type:   A positive  Rubella:     Immune             Tdap 2016 / flu given fall 2016                            Physical Exam:             Alert and oriented X3  Abdomen: soft, non-tender, non-distended              Fundus: firm, non-tender, U-1  Perineum: mild edema  Lochia: light  Extremities: trace edema, no calf pain or tenderness    A: PPD # 1 SVD with 2nd degree repair             Precipitous labor / birth - negative GBS  Doing well - stable status  P: Routine post partum orders  Dc home - increase motrin dose  Marlinda Mike CNM, MSN, FACNM 12/16/2014, 7:40 AM

## 2015-12-10 ENCOUNTER — Ambulatory Visit (INDEPENDENT_AMBULATORY_CARE_PROVIDER_SITE_OTHER): Payer: Commercial Managed Care - PPO | Admitting: Physician Assistant

## 2015-12-10 VITALS — BP 104/70 | HR 77 | Temp 98.0°F | Resp 16 | Ht 65.75 in | Wt 120.2 lb

## 2015-12-10 DIAGNOSIS — Z13220 Encounter for screening for lipoid disorders: Secondary | ICD-10-CM | POA: Diagnosis not present

## 2015-12-10 DIAGNOSIS — Z0000 Encounter for general adult medical examination without abnormal findings: Secondary | ICD-10-CM | POA: Diagnosis not present

## 2015-12-10 DIAGNOSIS — R5383 Other fatigue: Secondary | ICD-10-CM | POA: Diagnosis not present

## 2015-12-10 DIAGNOSIS — Z23 Encounter for immunization: Secondary | ICD-10-CM | POA: Diagnosis not present

## 2015-12-10 DIAGNOSIS — Z13228 Encounter for screening for other metabolic disorders: Secondary | ICD-10-CM

## 2015-12-10 LAB — CBC WITH DIFFERENTIAL/PLATELET
BASOS ABS: 61 {cells}/uL (ref 0–200)
Basophils Relative: 1 %
EOS ABS: 183 {cells}/uL (ref 15–500)
Eosinophils Relative: 3 %
HEMATOCRIT: 42.3 % (ref 35.0–45.0)
HEMOGLOBIN: 14.8 g/dL (ref 11.7–15.5)
LYMPHS ABS: 2440 {cells}/uL (ref 850–3900)
Lymphocytes Relative: 40 %
MCH: 28.4 pg (ref 27.0–33.0)
MCHC: 35 g/dL (ref 32.0–36.0)
MCV: 81 fL (ref 80.0–100.0)
MPV: 10.4 fL (ref 7.5–12.5)
Monocytes Absolute: 305 cells/uL (ref 200–950)
Monocytes Relative: 5 %
NEUTROS ABS: 3111 {cells}/uL (ref 1500–7800)
NEUTROS PCT: 51 %
Platelets: 268 10*3/uL (ref 140–400)
RBC: 5.22 MIL/uL — ABNORMAL HIGH (ref 3.80–5.10)
RDW: 12.6 % (ref 11.0–15.0)
WBC: 6.1 10*3/uL (ref 3.8–10.8)

## 2015-12-10 LAB — LIPID PANEL
CHOLESTEROL: 189 mg/dL (ref 125–200)
HDL: 48 mg/dL (ref >=46)
LDL Cholesterol: 127 mg/dL (ref <130)
TRIGLYCERIDES: 71 mg/dL (ref <150)
Total CHOL/HDL Ratio: 3.9 Ratio (ref <=5.0)
VLDL: 14 mg/dL (ref <30)

## 2015-12-10 LAB — COMPLETE METABOLIC PANEL WITH GFR
ALBUMIN: 4.8 g/dL (ref 3.6–5.1)
ALK PHOS: 56 U/L (ref 33–115)
ALT: 11 U/L (ref 6–29)
AST: 12 U/L (ref 10–30)
BILIRUBIN TOTAL: 0.4 mg/dL (ref 0.2–1.2)
BUN: 12 mg/dL (ref 7–25)
CALCIUM: 9.9 mg/dL (ref 8.6–10.2)
CO2: 24 mmol/L (ref 20–31)
Chloride: 105 mmol/L (ref 98–110)
Creat: 0.52 mg/dL (ref 0.50–1.10)
GLUCOSE: 87 mg/dL (ref 65–99)
POTASSIUM: 4.1 mmol/L (ref 3.5–5.3)
Sodium: 139 mmol/L (ref 135–146)
TOTAL PROTEIN: 7.5 g/dL (ref 6.1–8.1)

## 2015-12-10 LAB — TSH: TSH: 2.62 m[IU]/L

## 2015-12-10 NOTE — Progress Notes (Signed)
KILEE HEDDING  MRN: [id number] DOB: 05/21/1985  Subjective:  Pt is a 30 y.o. female who presents for annual physical exam.  Diet: She is strictly vegetarian. She eats lentils, rice, vegetables. No sodas. Drinks mostly water. Three meals a day. Has one BM every day.   Exercise: She does not exercise at the moment.  Social: She works from home. She sits down most of the day. Has two kids (1 yo and 77 yo) so they keep her extremely busy. She is a monogamous relationship with her husband.  Sleep: She gets about 9 hours of sleep a night. Does note that she often feels tired throughout the day.   Last dental exam: 07/2015  Last vision exam: 2014  Last pap smear: 03/2015, followed by [facility]  Vaccinations      Tetanus: 11/2014        Patient Active Problem List   Diagnosis Date Noted  . Term pregnancy 12/15/2014  . Active labor at term 12/15/2014  . SVD (spontaneous vaginal delivery) 12/15/2014  . Postpartum care following vaginal delivery (10/3) 12/15/2014  . Active labor 02/23/2013  . Vacuum extractor delivery, delivered (12/13) 02/23/2013    Current Outpatient Prescriptions on File Prior to Visit  Medication Sig Dispense Refill  . albuterol (PROVENTIL HFA;VENTOLIN HFA) 108 (90 BASE) MCG/ACT inhaler Inhale 2 puffs into the lungs every 6 (six) hours as needed for wheezing or shortness of breath.     Marland Kitchen ibuprofen (ADVIL,MOTRIN) 800 MG tablet Take 1 tablet (800 mg total) by mouth every 6 (six) hours. (Patient not taking: Reported on 12/10/2015) 45 tablet 0  . oxyCODONE-acetaminophen (PERCOCET/ROXICET) 5-325 MG tablet Take 1 tablet by mouth every 4 (four) hours as needed (for pain scale 4-7). (Patient not taking: Reported on 12/10/2015) 15 tablet 0  . Prenatal Vit-Fe Fumarate-FA (PRENATAL MULTIVITAMIN) TABS tablet Take 1 tablet by mouth daily at 12 noon.     No current facility-administered medications on file prior to visit.     No Known Allergies  Social History    Social History  . Marital status: Married    Spouse name: N/A  . Number of children: N/A  . Years of education: N/A   Occupational History  . health care consultant    Social History Main Topics  . Smoking status: Never Smoker  . Smokeless tobacco: Never Used  . Alcohol use Yes     Comment: Socially  . Drug use: No  . Sexual activity: Yes    Partners: Male    Birth control/ protection: None   Other Topics Concern  . None   Social History Narrative   Exercise-- no    Past Surgical History:  Procedure Laterality Date  . NO PAST SURGERIES      Family History  Problem Relation Age of Onset  . Arthritis Mother   . Hypertension Mother   . Hyperlipidemia Mother   . Hypertension Father   . Hyperlipidemia Father     Review of Systems  Constitutional: Negative.   HENT: Negative.   Eyes: Negative.   Respiratory: Negative.   Cardiovascular: Negative.   Gastrointestinal: Negative.   Endocrine: Negative.   Genitourinary: Negative.   Musculoskeletal: Negative.   Skin: Negative.   Allergic/Immunologic: Negative.   Neurological: Negative.   Hematological: Negative.   Psychiatric/Behavioral: Negative.    Objective:  BP 104/70 (BP Location: Right Arm, Patient Position: Sitting, Cuff Size: Normal)   Pulse 77   Temp 98 F (36.7 C) (Oral)  Resp 16   Ht 5' 5.75" (1.67 m)   Wt 120 lb 3.2 oz (54.5 kg)   LMP 11/17/2015 (Exact Date)   SpO2 100%   BMI 19.55 kg/m   Physical Exam  Constitutional: She is oriented to person, place, and time and well-developed, well-nourished, and in no distress.  HENT:  Head: Normocephalic and atraumatic.  Right Ear: Hearing, tympanic membrane, external ear and ear canal normal.  Left Ear: Hearing, tympanic membrane, external ear and ear canal normal.  Nose: Nose normal.  Mouth/Throat: Uvula is midline, oropharynx is clear and moist and mucous membranes are normal. No oropharyngeal exudate.  Eyes: EOM and lids are normal. Pupils are  equal, round, and reactive to light. Right conjunctiva is injected. Left conjunctiva is injected. No scleral icterus.  Neck: Trachea normal and normal range of motion. No thyroid mass and no thyromegaly present.  Cardiovascular: Normal rate, regular rhythm, normal heart sounds and intact distal pulses.   Pulmonary/Chest: Effort normal and breath sounds normal.  Abdominal: Soft. Normal appearance and bowel sounds are normal. There is no tenderness.  Lymphadenopathy:       Head (right side): No tonsillar, no preauricular, no posterior auricular and no occipital adenopathy present.       Head (left side): No tonsillar, no preauricular, no posterior auricular and no occipital adenopathy present.    She has no cervical adenopathy.       Right: No supraclavicular adenopathy present.       Left: No supraclavicular adenopathy present.  Neurological: She is alert and oriented to person, place, and time. She has normal sensation, normal strength and normal reflexes. Gait normal.  Skin: Skin is warm and dry.  Psychiatric: Affect normal.    Visual Acuity Screening   Right eye Left eye Both eyes  Without correction: 20/25 20/25 20/20   With correction:       Assessment and Plan :  Discussed healthy lifestyle, diet, exercise, preventative care, vaccinations, and addressed patient's concerns. Plan for follow up in one year. Otherwise, plan for specific conditions below.  1. Annual physical exam Await lab results  -Recommend daily multivitamin and begin daily structured exercise  2. Screening for metabolic disorder - COMPLETE METABOLIC PANEL WITH GFR  3. Screening, lipid - Lipid panel  4. Other fatigue - CBC with Differential/Platelet - TSH  5. Flu vaccine need - Flu Vaccine QUAD 36+ mos IM  Benjiman CoreBrittany Azreal Stthomas PA-C  [facility] 12/10/2015 11:06 AM

## 2015-12-10 NOTE — Patient Instructions (Addendum)
Continue the healthy diet, I would add a multivitamin. Try to start doing at least 20 minutes of structured exercise daily! Thank you for letting me participate in your health and well being.    IF you received an x-ray today, you will receive an invoice from [facility]. Please contact [facility] at [phone] with questions or concerns regarding your invoice.   IF you received labwork today, you will receive an invoice from [facility]. Please contact [facility] at [phone] with questions or concerns regarding your invoice.   Our billing staff will not be able to assist you with questions regarding bills from these companies.  You will be contacted with the lab results as soon as they are available. The fastest way to get your results is to activate your My Chart account. Instructions are located on the last page of this paperwork. If you have not heard from us regarding the results in 2 weeks, please contact this office.

## 2018-08-02 DIAGNOSIS — Z130 Encounter for screening for diseases of the blood and blood-forming organs and certain disorders involving the immune mechanism: Secondary | ICD-10-CM | POA: Diagnosis not present

## 2018-08-02 DIAGNOSIS — Z1151 Encounter for screening for human papillomavirus (HPV): Secondary | ICD-10-CM | POA: Diagnosis not present

## 2018-08-02 DIAGNOSIS — Z681 Body mass index (BMI) 19 or less, adult: Secondary | ICD-10-CM | POA: Diagnosis not present

## 2018-08-02 DIAGNOSIS — Z01419 Encounter for gynecological examination (general) (routine) without abnormal findings: Secondary | ICD-10-CM | POA: Diagnosis not present

## 2018-08-13 ENCOUNTER — Telehealth: Payer: Self-pay | Admitting: Hematology and Oncology

## 2018-08-13 NOTE — Telephone Encounter (Signed)
A new hem appt has been scheduled for the pt to see Dr. Pamelia Hoit on 6/8 at 9am. Pt has been made aware to arrive 15 minutes early. Letter mailed.

## 2018-08-19 NOTE — Progress Notes (Signed)
[facility] CONSULT NOTE  Patient Care Team: System, Pcp Not In as PCP - General  CHIEF COMPLAINTS/PURPOSE OF CONSULTATION: Newly diagnosed low protein S  HISTORY OF PRESENTING ILLNESS:  Tina Burke 33 y.o. female is here because of recent diagnosis of low protein S. It was discovered on blood work done at [facility] on 08/02/18 that showed total protein S 47, free protein S 58. She presents to the clinic today for initial evaluation.  Her father died of a DVT and PE and did not have any risk factors.  He also did not undergo hypercoagulability testing.  Because of her family history, she performed extensive hypercoagulability work testing with her gynecologist and she is here today to discuss the couple of abnormalities noted which include heterozygous mutation of MTHFR and a protein S deficiency which is very mild.  She has had 2 children in 2014 and 2016 and did not have any miscarriages.  There is no other family history of breast cancer.  She had a discussion with her gynecologist and determined that she should not be on any oral contraception because of thrombosis risk.  She also travels a lot for work and that is also another risk factor.  I discussed the role of compression stockings.  I reviewed her records extensively and collaborated the history with the patient.   MEDICAL HISTORY:  Past Medical History:  Diagnosis Date  . Anemia   . Asthma   . Hx of varicella   . IUGR (intrauterine growth restriction) affecting care of mother   . Postpartum care following vaginal delivery (10/3) 12/15/2014    SURGICAL HISTORY: Past Surgical History:  Procedure Laterality Date  . NO PAST SURGERIES      SOCIAL HISTORY: Social History   Socioeconomic History  . Marital status: Married    Spouse name: Not on file  . Number of children: Not on file  . Years of education: Not on file  . Highest education level: Not on file  Occupational History  . Occupation: Theatre managerhealth  care consultant  Social Needs  . Financial resource strain: Not on file  . Food insecurity:    Worry: Not on file    Inability: Not on file  . Transportation needs:    Medical: Not on file    Non-medical: Not on file  Tobacco Use  . Smoking status: Never Smoker  . Smokeless tobacco: Never Used  Substance and Sexual Activity  . Alcohol use: Yes    Comment: Socially  . Drug use: No  . Sexual activity: Yes    Partners: Male    Birth control/protection: None  Lifestyle  . Physical activity:    Days per week: Not on file    Minutes per session: Not on file  . Stress: Not on file  Relationships  . Social connections:    Talks on phone: Not on file    Gets together: Not on file    Attends religious service: Not on file    Active member of club or organization: Not on file    Attends meetings of clubs or organizations: Not on file    Relationship status: Not on file  . Intimate partner violence:    Fear of current or ex partner: Not on file    Emotionally abused: Not on file    Physically abused: Not on file    Forced sexual activity: Not on file  Other Topics Concern  . Not on file  Social History  Narrative   Exercise-- no    FAMILY HISTORY: Family History  Problem Relation Age of Onset  . Arthritis Mother   . Hypertension Mother   . Hyperlipidemia Mother   . Hypertension Father   . Hyperlipidemia Father   . Hyperlipidemia Maternal Grandmother   . Hypertension Maternal Grandmother   . Hyperlipidemia Maternal Grandfather   . Hypertension Maternal Grandfather   . Hyperlipidemia Paternal Grandmother   . Hypertension Paternal Grandmother   . Hyperlipidemia Paternal Grandfather   . Kidney disease Paternal Grandfather     ALLERGIES:  has No Known Allergies.  MEDICATIONS:  No current outpatient medications on file.   No current facility-administered medications for this visit.     REVIEW OF SYSTEMS:   Constitutional: Denies fevers, chills or abnormal night  sweats Eyes: Denies blurriness of vision, double vision or watery eyes Ears, nose, mouth, throat, and face: Denies mucositis or sore throat Respiratory: Denies cough, dyspnea or wheezes Cardiovascular: Denies palpitation, chest discomfort or lower extremity swelling Gastrointestinal:  Denies nausea, heartburn or change in bowel habits Skin: Denies abnormal skin rashes Lymphatics: Denies new lymphadenopathy or easy bruising Neurological:Denies numbness, tingling or new weaknesses Behavioral/Psych: Mood is stable, no new changes  Breast: Denies any palpable lumps or discharge All other systems were reviewed with the patient and are negative.  PHYSICAL EXAMINATION: ECOG PERFORMANCE STATUS: 1 - Symptomatic but completely ambulatory  Vitals:   08/20/18 0918  BP: 126/85  Pulse: 70  Resp: 16  Temp: 98.5 F (36.9 C)  SpO2: 100%   Filed Weights   08/20/18 0918  Weight: 115 lb (52.2 kg)    GENERAL:alert, no distress and comfortable SKIN: skin color, texture, turgor are normal, no rashes or significant lesions EYES: normal, conjunctiva are pink and non-injected, sclera clear OROPHARYNX:no exudate, no erythema and lips, buccal mucosa, and tongue normal  NECK: supple, thyroid normal size, non-tender, without nodularity LYMPH:  no palpable lymphadenopathy in the cervical, axillary or inguinal LUNGS: clear to auscultation and percussion with normal breathing effort HEART: regular rate & rhythm and no murmurs and no lower extremity edema ABDOMEN:abdomen soft, non-tender and normal bowel sounds Musculoskeletal:no cyanosis of digits and no clubbing  PSYCH: alert & oriented x 3 with fluent speech NEURO: no focal motor/sensory deficits  LABORATORY DATA:  I have reviewed the data as listed Lab Results  Component Value Date   WBC 6.1 12/10/2015   HGB 14.8 12/10/2015   HCT 42.3 12/10/2015   MCV 81.0 12/10/2015   PLT 268 12/10/2015   Lab Results  Component Value Date   NA 139  12/10/2015   K 4.1 12/10/2015   CL 105 12/10/2015   CO2 24 12/10/2015    RADIOGRAPHIC STUDIES: I have personally reviewed the radiological reports and agreed with the findings in the report.  ASSESSMENT AND PLAN:  Protein S deficiency (HCC) Protein S deficiency: 49% MTHFR gene mutation: Heterozygous Patient's father died of DVT and PE and because of which her gynecologist performed an extensive hypercoagulability work-up.  I discussed with the patient the mechanism of protein S and how it can help prevent excessive clotting.  However the degree of deficiency is very mild and it does not warrant any specific treatment since it is genetic in origin.  Regarding MTHFR gene mutation, I discussed with the patient that these mutations can increase homocystine levels and there has been data that elevated homocysteine may cause cardiovascular increased risk.  I encouraged her to take vitamin B12 and folic acid supplementation and  I would not check homocysteine levels in her. Plan: There is no need for anticoagulation because of the MTHFR gene mutation.    I also did not recommend testing her family for these mutations. We discussed the thrombosis threshold model and how each risk factor can increase the risk of thrombosis.  Based on this, she decided not to take oral contraception and will use nonhormonal contraception methods.  Return to clinic on an as-needed basis.  All questions were answered. The patient knows to call the clinic with any problems, questions or concerns.   Rulon Eisenmenger, MD 08/20/2018    I, Molly Dorshimer, am acting as scribe for Nicholas Lose, MD.  I have reviewed the above documentation for accuracy and completeness, and I agree with the above.

## 2018-08-20 ENCOUNTER — Inpatient Hospital Stay: Payer: BC Managed Care – PPO | Attending: Hematology and Oncology | Admitting: Hematology and Oncology

## 2018-08-20 ENCOUNTER — Other Ambulatory Visit: Payer: Self-pay

## 2018-08-20 DIAGNOSIS — D6859 Other primary thrombophilia: Secondary | ICD-10-CM | POA: Insufficient documentation

## 2018-08-20 DIAGNOSIS — E7212 Methylenetetrahydrofolate reductase deficiency: Secondary | ICD-10-CM | POA: Diagnosis not present

## 2018-08-20 NOTE — Assessment & Plan Note (Signed)
Protein S deficiency: 49% MTHFR gene mutation: Heterozygous Patient's father died of DVT and PE and because of which her gynecologist performed an extensive hypercoagulability work-up.  I discussed with the patient the mechanism of protein S and how it can help prevent excessive clotting.  However the degree of deficiency is very mild and it does not warrant any specific treatment since it is genetic in origin.  Regarding MTHFR gene mutation, I discussed with the patient that these mutations can increase homocystine levels and there has been data that elevated homocysteine may cause cardiovascular increased risk.  I encouraged her to take vitamin D64 and folic acid supplementation and I would not check homocysteine levels in her. Plan: There is no need for anticoagulation because of the MTHFR gene mutation.    I also did not recommend testing her family for these mutations. We discussed the thrombosis threshold model and how each risk factor can increase the risk of thrombosis.  Based on this, she decided not to take oral contraception and will use nonhormonal contraception methods.  Return to clinic on an as-needed basis.

## 2018-08-22 DIAGNOSIS — Z30430 Encounter for insertion of intrauterine contraceptive device: Secondary | ICD-10-CM | POA: Diagnosis not present

## 2018-08-22 DIAGNOSIS — Z3202 Encounter for pregnancy test, result negative: Secondary | ICD-10-CM | POA: Diagnosis not present

## 2018-09-19 DIAGNOSIS — T8332XA Displacement of intrauterine contraceptive device, initial encounter: Secondary | ICD-10-CM | POA: Diagnosis not present

## 2018-09-19 DIAGNOSIS — R110 Nausea: Secondary | ICD-10-CM | POA: Diagnosis not present

## 2018-12-12 DIAGNOSIS — Z30432 Encounter for removal of intrauterine contraceptive device: Secondary | ICD-10-CM | POA: Diagnosis not present

## 2019-04-09 ENCOUNTER — Ambulatory Visit: Payer: BC Managed Care – PPO | Attending: Internal Medicine

## 2019-04-09 DIAGNOSIS — Z20822 Contact with and (suspected) exposure to covid-19: Secondary | ICD-10-CM | POA: Diagnosis not present

## 2019-04-10 LAB — NOVEL CORONAVIRUS, NAA: SARS-CoV-2, NAA: NOT DETECTED

## 2019-04-18 DIAGNOSIS — Z8639 Personal history of other endocrine, nutritional and metabolic disease: Secondary | ICD-10-CM | POA: Diagnosis not present

## 2019-04-18 DIAGNOSIS — Z1329 Encounter for screening for other suspected endocrine disorder: Secondary | ICD-10-CM | POA: Diagnosis not present

## 2019-04-18 DIAGNOSIS — Z681 Body mass index (BMI) 19 or less, adult: Secondary | ICD-10-CM | POA: Diagnosis not present

## 2019-04-18 DIAGNOSIS — Z13220 Encounter for screening for lipoid disorders: Secondary | ICD-10-CM | POA: Diagnosis not present

## 2019-04-18 DIAGNOSIS — Z1321 Encounter for screening for nutritional disorder: Secondary | ICD-10-CM | POA: Diagnosis not present

## 2019-04-18 DIAGNOSIS — Z23 Encounter for immunization: Secondary | ICD-10-CM | POA: Diagnosis not present

## 2019-04-18 DIAGNOSIS — Z130 Encounter for screening for diseases of the blood and blood-forming organs and certain disorders involving the immune mechanism: Secondary | ICD-10-CM | POA: Diagnosis not present

## 2019-04-18 DIAGNOSIS — Z0000 Encounter for general adult medical examination without abnormal findings: Secondary | ICD-10-CM | POA: Diagnosis not present

## 2019-04-18 DIAGNOSIS — R5383 Other fatigue: Secondary | ICD-10-CM | POA: Diagnosis not present

## 2019-04-18 DIAGNOSIS — Z713 Dietary counseling and surveillance: Secondary | ICD-10-CM | POA: Diagnosis not present

## 2019-04-24 DIAGNOSIS — E538 Deficiency of other specified B group vitamins: Secondary | ICD-10-CM | POA: Diagnosis not present

## 2019-05-06 DIAGNOSIS — E538 Deficiency of other specified B group vitamins: Secondary | ICD-10-CM | POA: Diagnosis not present

## 2019-05-13 DIAGNOSIS — E538 Deficiency of other specified B group vitamins: Secondary | ICD-10-CM | POA: Diagnosis not present

## 2019-05-20 DIAGNOSIS — E538 Deficiency of other specified B group vitamins: Secondary | ICD-10-CM | POA: Diagnosis not present

## 2019-05-27 DIAGNOSIS — E538 Deficiency of other specified B group vitamins: Secondary | ICD-10-CM | POA: Diagnosis not present

## 2019-06-19 DIAGNOSIS — Z0000 Encounter for general adult medical examination without abnormal findings: Secondary | ICD-10-CM | POA: Diagnosis not present

## 2019-09-04 DIAGNOSIS — D235 Other benign neoplasm of skin of trunk: Secondary | ICD-10-CM | POA: Diagnosis not present

## 2019-09-04 DIAGNOSIS — D2270 Melanocytic nevi of unspecified lower limb, including hip: Secondary | ICD-10-CM | POA: Diagnosis not present

## 2019-09-04 DIAGNOSIS — L7211 Pilar cyst: Secondary | ICD-10-CM | POA: Diagnosis not present

## 2019-10-29 DIAGNOSIS — Z01419 Encounter for gynecological examination (general) (routine) without abnormal findings: Secondary | ICD-10-CM | POA: Diagnosis not present

## 2019-10-29 DIAGNOSIS — Z681 Body mass index (BMI) 19 or less, adult: Secondary | ICD-10-CM | POA: Diagnosis not present

## 2020-08-04 ENCOUNTER — Emergency Department (HOSPITAL_COMMUNITY)
Admission: EM | Admit: 2020-08-04 | Discharge: 2020-08-04 | Disposition: A | Discharge status: Home / Self Care | Payer: No Typology Code available for payment source | Attending: Emergency Medicine | Admitting: Emergency Medicine

## 2020-08-04 ENCOUNTER — Encounter (HOSPITAL_COMMUNITY): Payer: Self-pay

## 2020-08-04 ENCOUNTER — Emergency Department (HOSPITAL_COMMUNITY): Payer: No Typology Code available for payment source

## 2020-08-04 ENCOUNTER — Other Ambulatory Visit: Payer: Self-pay

## 2020-08-04 DIAGNOSIS — J45909 Unspecified asthma, uncomplicated: Secondary | ICD-10-CM | POA: Insufficient documentation

## 2020-08-04 DIAGNOSIS — N132 Hydronephrosis with renal and ureteral calculous obstruction: Secondary | ICD-10-CM | POA: Diagnosis not present

## 2020-08-04 DIAGNOSIS — N200 Calculus of kidney: Secondary | ICD-10-CM

## 2020-08-04 DIAGNOSIS — Z20822 Contact with and (suspected) exposure to covid-19: Secondary | ICD-10-CM | POA: Diagnosis not present

## 2020-08-04 DIAGNOSIS — R109 Unspecified abdominal pain: Secondary | ICD-10-CM | POA: Diagnosis present

## 2020-08-04 HISTORY — DX: Other primary thrombophilia: D68.59

## 2020-08-04 LAB — COMPREHENSIVE METABOLIC PANEL
ALT: 20 U/L (ref 0–44)
AST: 20 U/L (ref 15–41)
Albumin: 4.9 g/dL (ref 3.5–5.0)
Alkaline Phosphatase: 45 U/L (ref 38–126)
Anion gap: 8 (ref 5–15)
BUN: 16 mg/dL (ref 6–20)
CO2: 26 mmol/L (ref 22–32)
Calcium: 9.6 mg/dL (ref 8.9–10.3)
Chloride: 103 mmol/L (ref 98–111)
Creatinine, Ser: 0.82 mg/dL (ref 0.44–1.00)
GFR, Estimated: >60 mL/min (ref >60)
Glucose, Bld: 107 mg/dL — ABNORMAL HIGH (ref 70–99)
Potassium: 3.9 mmol/L (ref 3.5–5.1)
Sodium: 137 mmol/L (ref 135–145)
Total Bilirubin: 0.4 mg/dL (ref 0.3–1.2)
Total Protein: 8.3 g/dL — ABNORMAL HIGH (ref 6.5–8.1)

## 2020-08-04 LAB — URINALYSIS, ROUTINE W REFLEX MICROSCOPIC
Bacteria, UA: NONE SEEN
Bilirubin Urine: NEGATIVE
Glucose, UA: NEGATIVE mg/dL
Ketones, ur: 5 mg/dL — AB
Nitrite: NEGATIVE
Protein, ur: 30 mg/dL — AB
RBC / HPF: >50 RBC/hpf — ABNORMAL HIGH (ref 0–5)
Specific Gravity, Urine: 1.033 — ABNORMAL HIGH (ref 1.005–1.030)
pH: 5 (ref 5.0–8.0)

## 2020-08-04 LAB — CBC WITH DIFFERENTIAL/PLATELET
Abs Immature Granulocytes: 0.06 10*3/uL (ref 0.00–0.07)
Basophils Absolute: 0 10*3/uL (ref 0.0–0.1)
Basophils Relative: 0 %
Eosinophils Absolute: 0 10*3/uL (ref 0.0–0.5)
Eosinophils Relative: 0 %
HCT: 43.4 % (ref 36.0–46.0)
Hemoglobin: 14.6 g/dL (ref 12.0–15.0)
Immature Granulocytes: 1 %
Lymphocytes Relative: 6 %
Lymphs Abs: 0.8 10*3/uL (ref 0.7–4.0)
MCH: 29.3 pg (ref 26.0–34.0)
MCHC: 33.6 g/dL (ref 30.0–36.0)
MCV: 87 fL (ref 80.0–100.0)
Monocytes Absolute: 0.4 10*3/uL (ref 0.1–1.0)
Monocytes Relative: 3 %
Neutro Abs: 12 10*3/uL — ABNORMAL HIGH (ref 1.7–7.7)
Neutrophils Relative %: 90 %
Platelets: 296 10*3/uL (ref 150–400)
RBC: 4.99 MIL/uL (ref 3.87–5.11)
RDW: 11.9 % (ref 11.5–15.5)
WBC: 13.2 10*3/uL — ABNORMAL HIGH (ref 4.0–10.5)
nRBC: 0 % (ref 0.0–0.2)

## 2020-08-04 LAB — LIPASE, BLOOD: Lipase: 41 U/L (ref 11–51)

## 2020-08-04 LAB — RESP PANEL BY RT-PCR (FLU A&B, COVID) ARPGX2
Influenza A by PCR: NEGATIVE
Influenza B by PCR: NEGATIVE
SARS Coronavirus 2 by RT PCR: NEGATIVE

## 2020-08-04 LAB — POC URINE PREG, ED: Preg Test, Ur: NEGATIVE

## 2020-08-04 MED ORDER — OXYCODONE-ACETAMINOPHEN 5-325 MG PO TABS: 1.0000 | ORAL_TABLET | Freq: Three times a day (TID) | ORAL | 0 refills | Status: AC | PRN

## 2020-08-04 MED ORDER — ONDANSETRON 4 MG PO TBDP: 4.0000 mg | ORAL_TABLET | Freq: Three times a day (TID) | ORAL | 0 refills | Status: AC | PRN

## 2020-08-04 NOTE — ED Provider Notes (Signed)
Emergency Medicine Provider Triage Evaluation Note  Tina Burke , a 35 y.o. female  was evaluated in triage.  Pt complains of left abdominal left flank pain onset 2 AM.  Associated with a few episodes of nonbloody/nonbilious emesis along with dysuria.  Denies similar in the past.  Reports she is otherwise healthy.  Review of Systems  Positive: Left side abdominal pain, left flank pain, dysuria, nausea/vomiting Negative: Fever chills chest pain shortness of breath numbness/tingling, weakness, fall/injury, hematuria or any additional concerns.  Physical Exam  BP 125/88 (BP Location: Left Arm)   Pulse 71   Temp 98.4 F (36.9 C) (Oral)   Resp 16   Ht 5\' 5"  (1.651 m)   Wt 50.8 kg   SpO2 100%   BMI 18.64 kg/m  Gen:   Awake, no distress Resp:  Normal effort MSK:   Moves extremities without difficulty Other:  Abdomen soft, diffuse left-sided tenderness to palpation.  Mild left CVA tenderness.  No guarding or rebound.  No right-sided tenderness.  Medical Decision Making  Medically screening exam initiated at 9:19 AM.  Appropriate orders placed.  DENNA FRYBERGER was informed that the remainder of the evaluation will be completed by another provider, this initial triage assessment does not replace that evaluation, and the importance of remaining in the ED until their evaluation is complete.   Note: Portions of this report may have been transcribed using voice recognition software. Every effort was made to ensure accuracy; however, inadvertent computerized transcription errors may still be present.    Devota Pace, PA-C 08/04/20 08/06/20    [id number], MD 08/05/20 1524

## 2020-08-04 NOTE — ED Provider Notes (Signed)
[facility]-EMERGENCY DEPT Provider Note   CSN: [id number] Arrival date & time: 08/04/20  7253     History Chief Complaint  Patient presents with  . Flank Pain  . Emesis  . Abdominal Pain    GLENN CHRISTO is a 35 y.o. female.  HPI Patient presents with left flank and abdominal pain.  Began around 2 in the morning acutely.  Has had nausea and vomiting.  States she has some dysuria.  No fevers but states she has had chills.  Has not had pains like this before.  No vaginal bleeding or discharge.  States she does not get menses due to her birth control.  Patient hopes she is not pregnant.  No vaginal bleeding or discharge.  Some pain in the abdomen.  Took some pain relief medicine at home and states she is feeling better than she was but still having pain.    Past Medical History:  Diagnosis Date  . Anemia   . Asthma   . Hx of varicella   . IUGR (intrauterine growth restriction) affecting care of mother   . Postpartum care following vaginal delivery (10/3) 12/15/2014  . Protein S deficiency [facility])     Patient Active Problem List   Diagnosis Date Noted  . Protein S deficiency (HCC) 08/20/2018  . Term pregnancy 12/15/2014  . SVD (spontaneous vaginal delivery) 12/15/2014  . Postpartum care following vaginal delivery (10/3) 12/15/2014  . Vacuum extractor delivery, delivered (12/13) 02/23/2013    Past Surgical History:  Procedure Laterality Date  . NO PAST SURGERIES       OB History    Gravida  2   Para  2   Term  2   Preterm  0   AB  0   Living  2     SAB  0   IAB  0   Ectopic  0   Multiple  0   Live Births  2           Family History  Problem Relation Age of Onset  . Arthritis Mother   . Hypertension Mother   . Hyperlipidemia Mother   . Hypertension Father   . Hyperlipidemia Father   . Hyperlipidemia Maternal Grandmother   . Hypertension Maternal Grandmother   . Hyperlipidemia Maternal Grandfather   . Hypertension  Maternal Grandfather   . Hyperlipidemia Paternal Grandmother   . Hypertension Paternal Grandmother   . Hyperlipidemia Paternal Grandfather   . Kidney disease Paternal Grandfather     Social History   Tobacco Use  . Smoking status: Never Smoker  . Smokeless tobacco: Never Used  Vaping Use  . Vaping Use: Never used  Substance Use Topics  . Alcohol use: Yes    Comment: Socially  . Drug use: No    Home Medications Prior to Admission medications   Medication Sig Start Date End Date Taking? Authorizing Provider  ondansetron (ZOFRAN-ODT) 4 MG disintegrating tablet Take 1 tablet (4 mg total) by mouth every 8 (eight) hours as needed for nausea or vomiting. 08/04/20  Yes Benjiman Core, MD  oxyCODONE-acetaminophen (PERCOCET/ROXICET) 5-325 MG tablet Take 1-2 tablets by mouth every 8 (eight) hours as needed for severe pain. 08/04/20  Yes Benjiman Core, MD    Allergies    Patient has no known allergies.  Review of Systems   Review of Systems  Constitutional: Positive for chills. Negative for appetite change and fever.  HENT: Negative for congestion.   Respiratory: Negative for shortness of breath.  Gastrointestinal: Positive for abdominal pain, nausea and vomiting.  Genitourinary: Positive for dysuria and flank pain.  Musculoskeletal: Negative for back pain.  Skin: Negative for rash.  Neurological: Negative for weakness.  Psychiatric/Behavioral: Negative for confusion.    Physical Exam Updated Vital Signs BP 101/62   Pulse 91   Temp 98.4 F (36.9 C) (Oral)   Resp 18   Ht 5\' 5"  (1.651 m)   Wt 50.8 kg   SpO2 100%   BMI 18.64 kg/m   Physical Exam Vitals and nursing note reviewed.  HENT:     Head: Normocephalic.  Cardiovascular:     Rate and Rhythm: Normal rate and regular rhythm.  Pulmonary:     Breath sounds: Normal breath sounds.  Abdominal:     Hernia: No hernia is present.     Comments: Left lower quadrant tenderness without rebound or guarding.  No hernia  palpated.  Genitourinary:    Comments: CVA tenderness on left. Skin:    General: Skin is warm.     Capillary Refill: Capillary refill takes less than 2 seconds.  Neurological:     Mental Status: She is alert and oriented to person, place, and time.     ED Results / Procedures / Treatments   Labs (all labs ordered are listed, but only abnormal results are displayed) Labs Reviewed  CBC WITH DIFFERENTIAL/PLATELET - Abnormal; Notable for the following components:      Result Value   WBC 13.2 (*)    Neutro Abs 12.0 (*)    All other components within normal limits  COMPREHENSIVE METABOLIC PANEL - Abnormal; Notable for the following components:   Glucose, Bld 107 (*)    Total Protein 8.3 (*)    All other components within normal limits  URINALYSIS, ROUTINE W REFLEX MICROSCOPIC - Abnormal; Notable for the following components:   APPearance HAZY (*)    Specific Gravity, Urine 1.033 (*)    Hgb urine dipstick LARGE (*)    Ketones, ur 5 (*)    Protein, ur 30 (*)    Leukocytes,Ua SMALL (*)    RBC / HPF >50 (*)    All other components within normal limits  RESP PANEL BY RT-PCR (FLU A&B, COVID) ARPGX2  LIPASE, BLOOD  POC URINE PREG, ED    EKG None  Radiology CT Renal Stone Study  Result Date: 08/04/2020 CLINICAL DATA:  Acute left flank and abdominal pain EXAM: CT ABDOMEN AND PELVIS WITHOUT CONTRAST TECHNIQUE: Multidetector CT imaging of the abdomen and pelvis was performed following the standard protocol without IV contrast. COMPARISON:  None. FINDINGS: Lower chest: No acute abnormality. Hepatobiliary: Limited without IV contrast. No large focal hepatic abnormality biliary dilatation. Gallbladder nondistended. Common bile duct nondilated. Pancreas: Unremarkable. No pancreatic ductal dilatation or surrounding inflammatory changes. Spleen: Normal in size without focal abnormality. Adrenals/Urinary Tract: Normal adrenal glands. Punctate nonobstructing intrarenal calculi bilaterally. Trace  left perinephric strandy edema. Mild left hydronephrosis and diffuse hydroureter. Left ureter is difficult to follow in the low pelvic area but suspect a punctate obstructing left UVJ calculus measuring 3 mm, image 68 series 2. Additional pelvic calcifications are felt to be outside the urinary tract compatible with venous phleboliths. No acute right urinary tract or ureteral calculus. No large focal renal abnormality.  Limited without IV contrast. Stomach/Bowel: Negative for bowel obstruction, significant dilatation, ileus, or free air. No free fluid, fluid collection, hemorrhage, hematoma, abscess, or ascites. Vascular/Lymphatic: Limited without IV contrast. No aneurysm. No bulky adenopathy. Reproductive: Uterus and bilateral adnexa  are unremarkable. Other: No abdominal wall hernia or abnormality. No abdominopelvic ascites. Musculoskeletal: No acute or significant osseous findings. IMPRESSION: Very mild left hydroureteronephrosis appearing secondary to a 3 mm mildly obstructing left distal UVJ calculus. Additional bilateral subcentimeter nonobstructing nephrolithiasis. Electronically Signed   By: Judie Petit.  Shick M.D.   On: 08/04/2020 11:32    Procedures Procedures   Medications Ordered in ED Medications - No data to display  ED Course  I have reviewed the triage vital signs and the nursing notes.  Pertinent labs & imaging results that were available during my care of the patient were reviewed by me and considered in my medical decision making (see chart for details).    MDM Rules/Calculators/A&P                          Patient with left-sided flank pain.  States chills but no white count and no fever.  Urine does not show infection.  Found to have 3 mm distal ureteral stone.  Unfortunately has bilateral stones in the kidneys to.  Informed of this.  Patient is feeling better not requiring pain medicine here.  I think patient stable for discharge.  Outpatient follow-up with urology.  Initial  differential diagnosis included but was not limited to kidney stone, diverticulitis, UTI Final Clinical Impression(s) / ED Diagnoses Final diagnoses:  Kidney stone    Rx / DC Orders ED Discharge Orders         Ordered    ondansetron (ZOFRAN-ODT) 4 MG disintegrating tablet  Every 8 hours PRN        08/04/20 1211    oxyCODONE-acetaminophen (PERCOCET/ROXICET) 5-325 MG tablet  Every 8 hours PRN        08/04/20 1211           Benjiman Core, MD 08/04/20 1212

## 2020-08-04 NOTE — ED Triage Notes (Signed)
Patient states she was awakened at 0200 today with left flank pain that radiates into the left lower abdomen. Patient reports that she has vomited x 3 today.

## 2021-11-06 IMAGING — CT CT RENAL STONE PROTOCOL
2 of 4 series · 16 of 46 positions shown, 18 images · non-contrast
Comparison: None.

CLINICAL DATA: Acute left flank and abdominal pain

EXAM:
CT ABDOMEN AND PELVIS WITHOUT CONTRAST
TECHNIQUE: Multidetector CT imaging of the abdomen and pelvis was performed
following the standard protocol without IV contrast.

[Series 2: axial st · axial · 0.75mm/px · z∈[+774,+1174]mm · 13 of 90 slices shown, 15 images]
[im 5/90  soft-tissue]
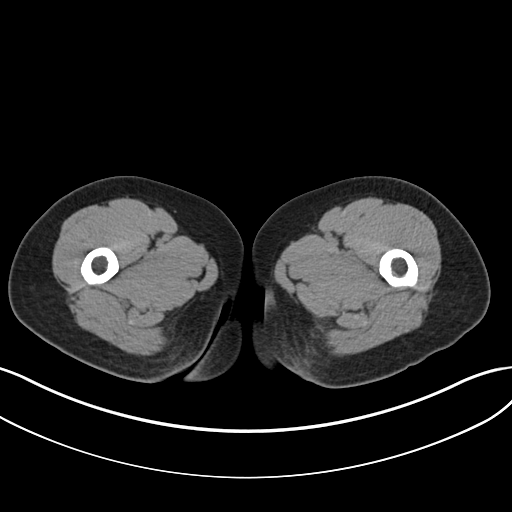
[im 5/90  bone]
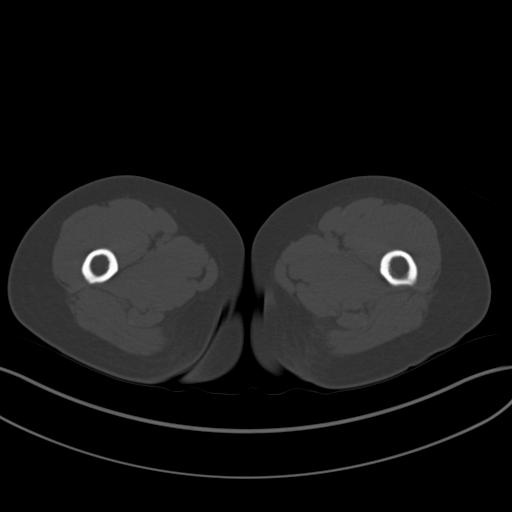
[im 10/90  soft-tissue]
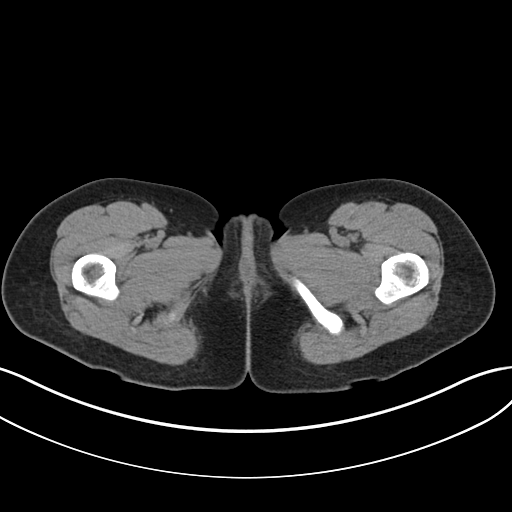
[im 20/90  soft-tissue]
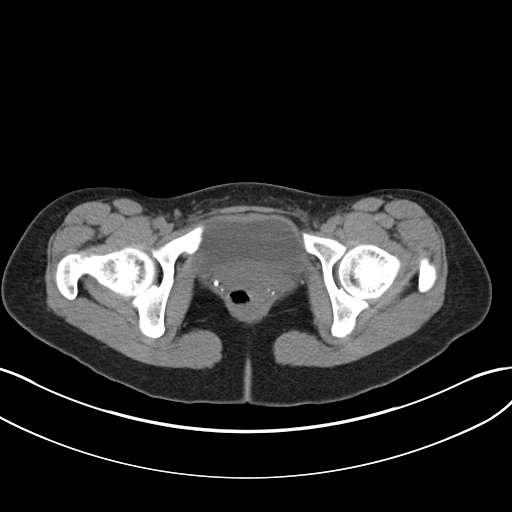
[im 25/90  soft-tissue]
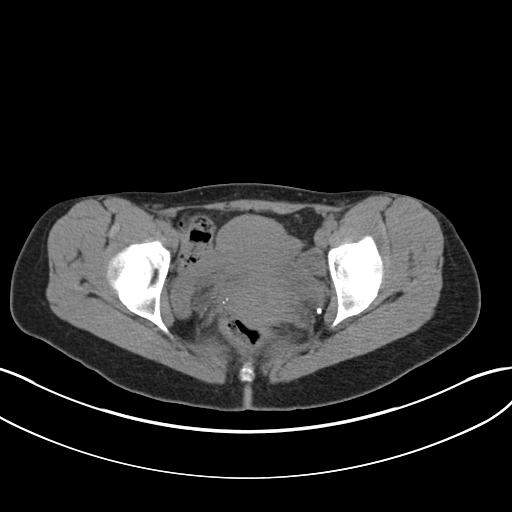
[im 30/90  soft-tissue]
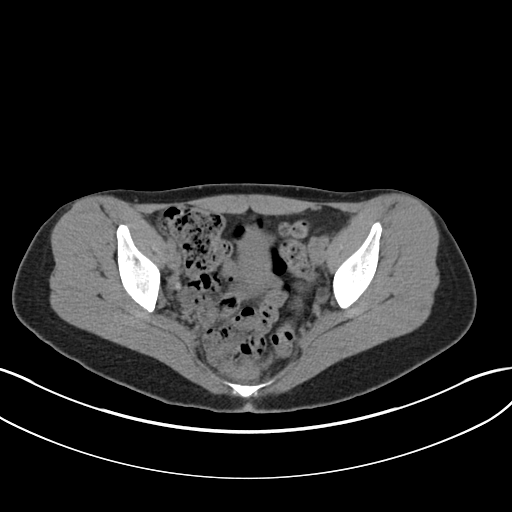
[im 40/90  soft-tissue]
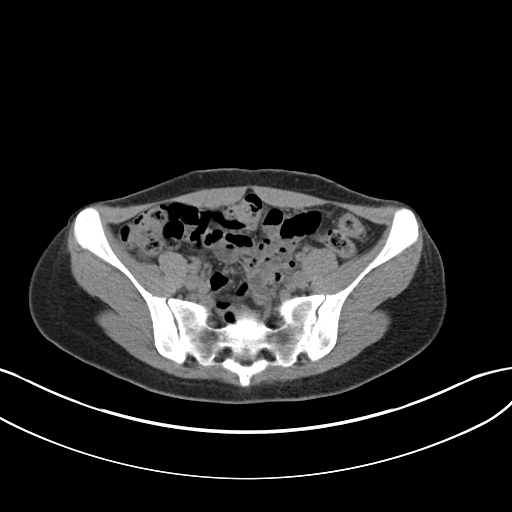
[im 45/90  soft-tissue]
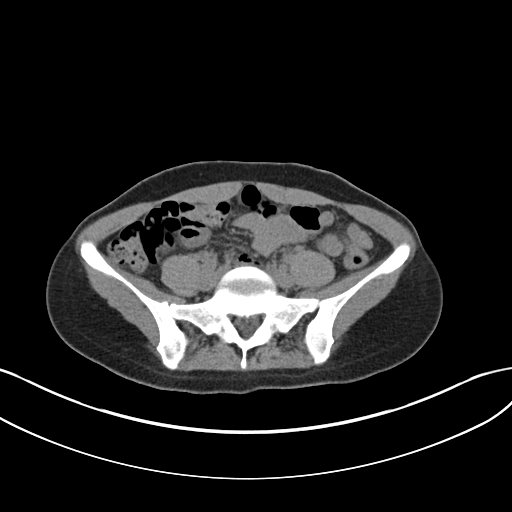
[im 50/90  soft-tissue]
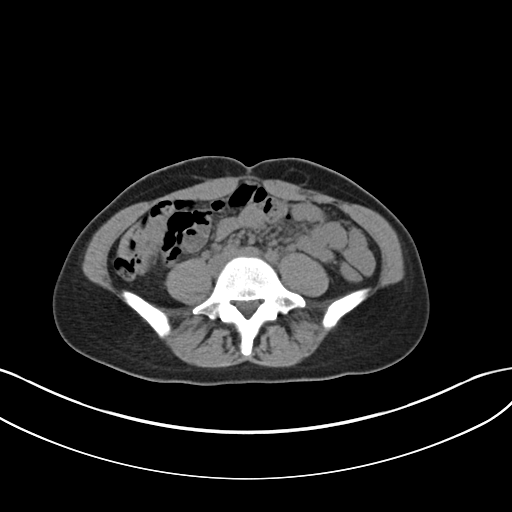
[im 60/90  soft-tissue]
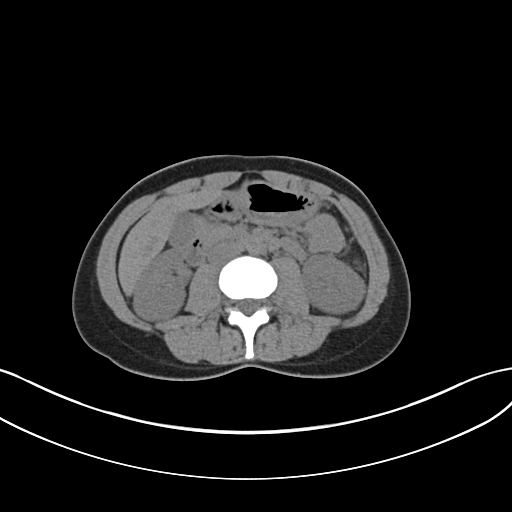
[im 60/90  bone]
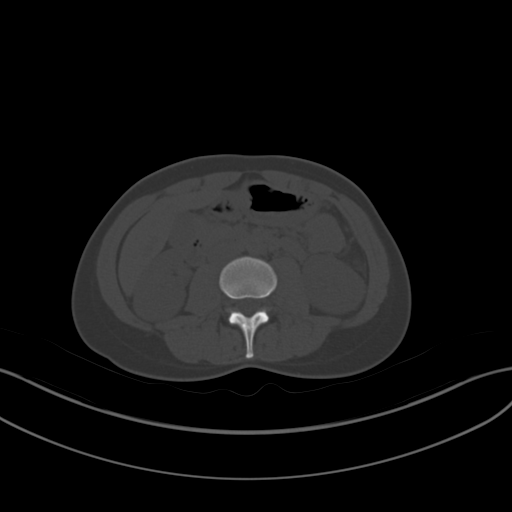
[im 65/90  soft-tissue]
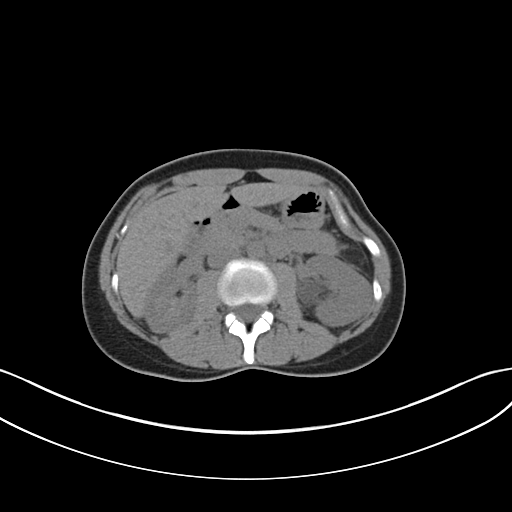
[im 70/90  soft-tissue]
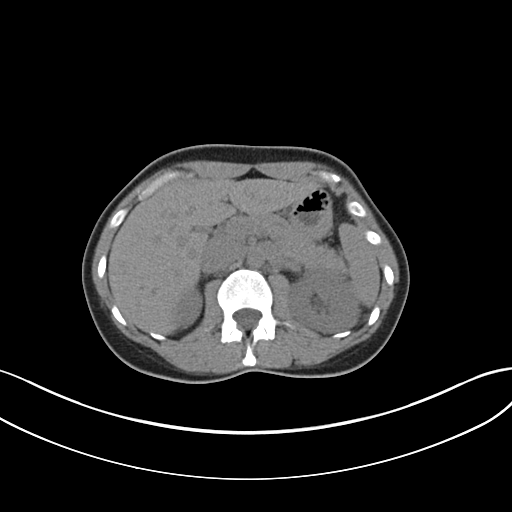
[im 80/90  soft-tissue]
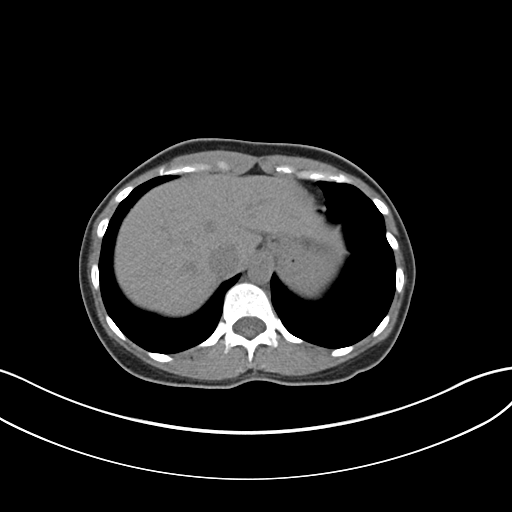
[im 85/90  soft-tissue]
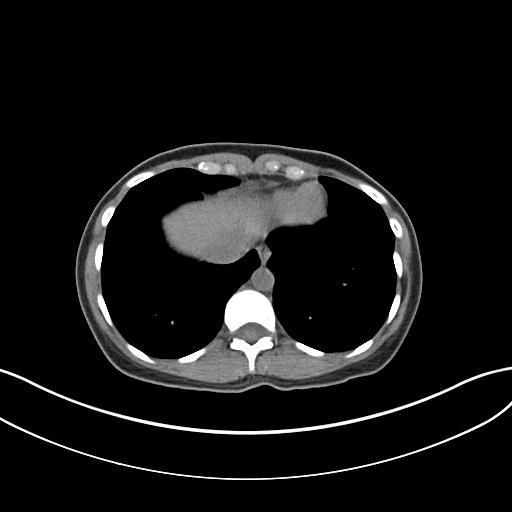

[Series 5: coronal · coronal · 0.80mm/px · 3 of 125 slices shown]
[im 42/125  soft-tissue]
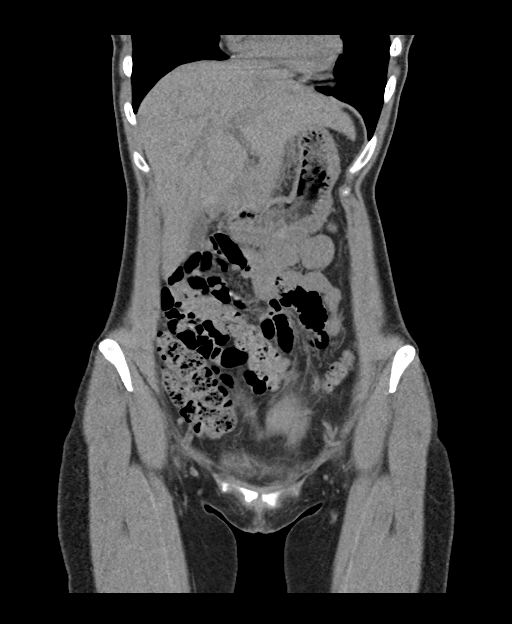
[im 56/125  soft-tissue]
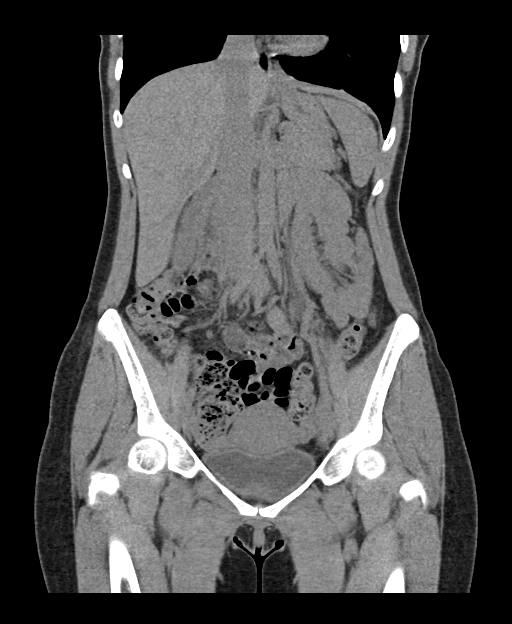
[im 69/125  soft-tissue]
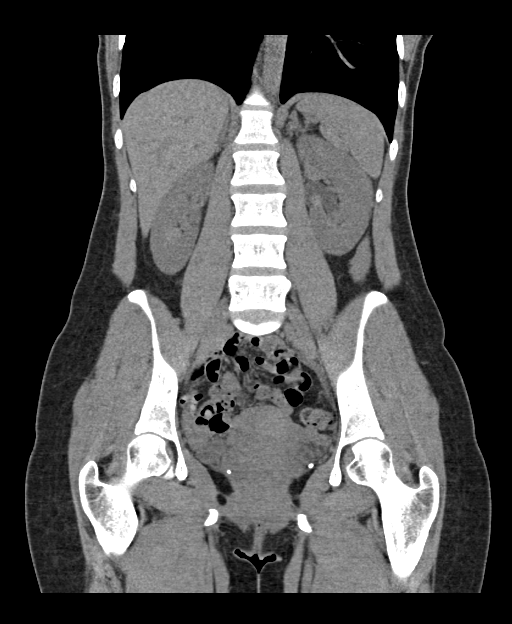

[16 of 46 positions shown; findings below may reference images not displayed]

FINDINGS: Lower chest: No acute abnormality.

Hepatobiliary: Limited without IV contrast. No large focal hepatic
abnormality biliary dilatation. Gallbladder nondistended. Common
bile duct nondilated.

Pancreas: Unremarkable. No pancreatic ductal dilatation or
surrounding inflammatory changes.

Spleen: Normal in size without focal abnormality.

Adrenals/Urinary Tract: Normal adrenal glands. Punctate
nonobstructing intrarenal calculi bilaterally.

Trace left perinephric strandy edema. Mild left hydronephrosis and
diffuse hydroureter. Left ureter is difficult to follow in the low
pelvic area but suspect a punctate obstructing left UVJ calculus
measuring 3 mm, image 68 series 2. Additional pelvic calcifications
are felt to be outside the urinary tract compatible with venous
phleboliths.

No acute right urinary tract or ureteral calculus.

No large focal renal abnormality.  Limited without IV contrast.

Stomach/Bowel: Negative for bowel obstruction, significant
dilatation, ileus, or free air. No free fluid, fluid collection,
hemorrhage, hematoma, abscess, or ascites.

Vascular/Lymphatic: Limited without IV contrast. No aneurysm. No
bulky adenopathy.

Reproductive: Uterus and bilateral adnexa are unremarkable.

Other: No abdominal wall hernia or abnormality. No abdominopelvic
ascites.

Musculoskeletal: No acute or significant osseous findings.
IMPRESSION: Very mild left hydroureteronephrosis appearing secondary to a 3 mm
mildly obstructing left distal UVJ calculus.

Additional bilateral subcentimeter nonobstructing nephrolithiasis.
